# Patient Record
Sex: Male | Born: 1985 | Race: White | Hispanic: No | Marital: Single | Smoking: Never smoker
Health system: Southern US, Community
[De-identification: ages and names within clinical notes are randomized; demographics above are authoritative.]

## PROBLEM LIST (undated history)

## (undated) DIAGNOSIS — G8929 Other chronic pain: Principal | ICD-10-CM

## (undated) DIAGNOSIS — Z8739 Personal history of other diseases of the musculoskeletal system and connective tissue: Secondary | ICD-10-CM

## (undated) DIAGNOSIS — Z9889 Other specified postprocedural states: Secondary | ICD-10-CM

## (undated) DIAGNOSIS — M545 Low back pain: Principal | ICD-10-CM

## (undated) DIAGNOSIS — Z8619 Personal history of other infectious and parasitic diseases: Secondary | ICD-10-CM

## (undated) HISTORY — DX: Other chronic pain: G89.29

## (undated) HISTORY — DX: Low back pain: M54.5

## (undated) HISTORY — PX: INGUINAL HERNIA REPAIR: SUR1180

## (undated) HISTORY — DX: Personal history of other infectious and parasitic diseases: Z86.19

## (undated) HISTORY — DX: Other specified postprocedural states: Z98.890

## (undated) HISTORY — DX: Personal history of other diseases of the musculoskeletal system and connective tissue: Z87.39

---

## 2010-06-27 ENCOUNTER — Emergency Department: Payer: Self-pay | Admitting: Emergency Medicine

## 2012-01-05 ENCOUNTER — Ambulatory Visit (INDEPENDENT_AMBULATORY_CARE_PROVIDER_SITE_OTHER): Admitting: Family Medicine

## 2012-01-05 ENCOUNTER — Encounter: Payer: Self-pay | Admitting: Family Medicine

## 2012-01-05 VITALS — BP 114/78 | HR 64 | Temp 98.0°F | Ht 70.0 in | Wt 171.5 lb

## 2012-01-05 DIAGNOSIS — R002 Palpitations: Secondary | ICD-10-CM

## 2012-01-05 DIAGNOSIS — R42 Dizziness and giddiness: Secondary | ICD-10-CM

## 2012-01-05 LAB — HEMOGLOBIN AND HEMATOCRIT, BLOOD: Hemoglobin: 17 g/dL (ref 13.0–17.0)

## 2012-01-05 LAB — BASIC METABOLIC PANEL
Calcium: 10.2 mg/dL (ref 8.4–10.5)
Creatinine, Ser: 1.1 mg/dL (ref 0.4–1.5)

## 2012-01-05 LAB — TSH: TSH: 4.03 u[IU]/mL (ref 0.35–5.50)

## 2012-01-05 NOTE — Patient Instructions (Addendum)
EKG today - slow but reassuring.  You could have some benign extra heart beats - if more symptomatic or more frequent, let us know. Blood work today. Good to see you today, call us with questions. Return as needed or in 1-2 years for physical.

## 2012-01-05 NOTE — Assessment & Plan Note (Addendum)
Anticipate benign occasional PAC/PVC. EKG today - sinus bradycardia at rate 41, normal axis, intervals, no ST/T changes. Discussed this. Overall very healthy. Check blood work today as well. He states that his heart rate dose increase to 160-170 range with high intensity exercise.

## 2012-01-05 NOTE — Assessment & Plan Note (Signed)
Sounds resolved, possibly related to low sugar.  Will monitor for now.  Check sugar today.

## 2012-01-05 NOTE — Progress Notes (Signed)
Subjective:    Patient ID: Lance Carter, male    DOB: [birth year], 26 y.o.   MRN: [id number]  HPI CC: new pt  Having some issues with irregular heart beats as well as occasional lightheadedness when gets home from work.  Gets yearly physicals with [employer].  Irregular heartbeat for the last year.  Described as skipped beats and heart flutter.  Noticed by GF.  Uncle with similar issue, told to decrease caffeine.  Has decreased amt of caffeine.  Rare soda.  Sporadic.  Denies chest pain/tightness, SOB, HA, dizziness.  Has never had EKG.  Has had more stressed.  Lightheadedness for 1 wk then stopped, no issues since then.  Described as "low blood sugar feeling", "fuzzy headed" and weak.  No presyncope, vertigo, imbalanced.  Stays hydrated during day.  No significant increase in exercise/exertion.  Eating improved symptoms.  At that time was working 10 hour shifts.  Works as Consulting civil engineer for [employer] Currently seeking counseling for relationship. Currently sexually active, uses protection.  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 Tetanus 2007  Medications and allergies reviewed and updated in chart.  Past histories reviewed and updated if relevant as below. There is no problem list on file for this patient.  Past Medical History  Diagnosis Date  . History of chicken pox    Past Surgical History  Procedure Date  . Inguinal hernia repair     as child   History  Substance Use Topics  . Smoking status: Never Smoker   . Smokeless tobacco: Never Used  . Alcohol Use: No   Family History  Problem Relation Age of Onset  . Alcohol abuse Maternal Grandmother     cirrhosis, rest of maternal side  . Diabetes Paternal Grandfather    Allergies  Allergen Reactions  . Sulfa Antibiotics Hives    No current outpatient prescriptions on file prior to visit.     Review of Systems  Constitutional: Negative for fever, chills, activity change, appetite change, fatigue and unexpected weight change.  HENT: Negative for hearing loss and neck pain.   Eyes: Negative for visual disturbance.  Respiratory: Negative for cough, chest tightness, shortness of breath and wheezing.   Cardiovascular: Positive for palpitations (see HPI). Negative for chest pain and leg swelling.  Gastrointestinal: Negative for nausea, vomiting, abdominal pain, diarrhea, constipation, blood in stool and abdominal distention.  Genitourinary: Negative for hematuria and difficulty urinating.  Musculoskeletal: Negative for myalgias and arthralgias.  Skin: Negative for rash.  Neurological: Positive for dizziness (see HPI) and headaches (mild). Negative for seizures and syncope.  Hematological: Does not bruise/bleed easily.  Psychiatric/Behavioral: Negative for dysphoric mood. The patient is not nervous/anxious.        Objective:   Physical Exam  Nursing note and vitals reviewed. Constitutional: He is oriented to person, place, and time. He appears well-developed and well-nourished. No distress.  HENT:  Head: Normocephalic and atraumatic.  Right Ear: External ear normal.  Left Ear: External ear normal.  Nose: Nose normal.  Mouth/Throat: Oropharynx is clear and moist. No oropharyngeal exudate.  Eyes: Conjunctivae and EOM are normal. Pupils are equal, round, and reactive to light.  Neck: Normal range of motion. Neck supple. No thyromegaly present.  Cardiovascular: Normal rate, regular rhythm, normal heart sounds and intact distal pulses.   No murmur heard. Pulses:  Radial pulses are 2+ on the right side, and 2+ on the left side.  Pulmonary/Chest: Effort normal and breath sounds normal. No respiratory distress. He has no wheezes. He has no rales.  Abdominal: Soft. Bowel sounds are normal. He exhibits no  distension and no mass. There is no tenderness. There is no rebound and no guarding.  Musculoskeletal: Normal range of motion. He exhibits no edema.  Lymphadenopathy:    He has no cervical adenopathy.  Neurological: He is alert and oriented to person, place, and time.       CN grossly intact, station and gait intact  Skin: Skin is warm and dry. No rash noted.  Psychiatric: He has a normal mood and affect. His behavior is normal. Judgment and thought content normal.       Assessment & Plan:

## 2013-06-26 DIAGNOSIS — M545 Low back pain, unspecified: Secondary | ICD-10-CM

## 2013-06-26 DIAGNOSIS — G8929 Other chronic pain: Secondary | ICD-10-CM

## 2013-06-26 HISTORY — DX: Other chronic pain: G89.29

## 2013-06-26 HISTORY — PX: LUMBAR DISC SURGERY: SHX700

## 2013-06-26 HISTORY — DX: Low back pain, unspecified: M54.50

## 2014-04-26 DIAGNOSIS — Z8739 Personal history of other diseases of the musculoskeletal system and connective tissue: Secondary | ICD-10-CM

## 2014-04-26 DIAGNOSIS — Z9889 Other specified postprocedural states: Secondary | ICD-10-CM

## 2014-04-26 HISTORY — DX: Personal history of other diseases of the musculoskeletal system and connective tissue: Z98.890

## 2014-04-26 HISTORY — DX: Personal history of other diseases of the musculoskeletal system and connective tissue: Z87.39

## 2015-03-17 ENCOUNTER — Emergency Department (HOSPITAL_COMMUNITY)
Admission: EM | Admit: 2015-03-17 | Discharge: 2015-03-17 | Disposition: A | Discharge status: Home / Self Care | Attending: Emergency Medicine | Admitting: Emergency Medicine

## 2015-03-17 ENCOUNTER — Emergency Department (HOSPITAL_COMMUNITY)

## 2015-03-17 ENCOUNTER — Encounter (HOSPITAL_COMMUNITY): Payer: Self-pay | Admitting: Emergency Medicine

## 2015-03-17 DIAGNOSIS — R52 Pain, unspecified: Secondary | ICD-10-CM

## 2015-03-17 DIAGNOSIS — M545 Low back pain: Secondary | ICD-10-CM

## 2015-03-17 DIAGNOSIS — R200 Anesthesia of skin: Secondary | ICD-10-CM | POA: Insufficient documentation

## 2015-03-17 DIAGNOSIS — Z8619 Personal history of other infectious and parasitic diseases: Secondary | ICD-10-CM | POA: Diagnosis not present

## 2015-03-17 DIAGNOSIS — Z79899 Other long term (current) drug therapy: Secondary | ICD-10-CM | POA: Diagnosis not present

## 2015-03-17 DIAGNOSIS — M5440 Lumbago with sciatica, unspecified side: Secondary | ICD-10-CM | POA: Insufficient documentation

## 2015-03-17 MED ORDER — METHOCARBAMOL 500 MG PO TABS: 500.0000 mg | ORAL_TABLET | Freq: Two times a day (BID) | ORAL | Status: DC | PRN

## 2015-03-17 NOTE — Discharge Instructions (Signed)
Back Pain, Adult °Low back pain is very common. About 1 in 5 people have back pain. The cause of low back pain is rarely dangerous. The pain often gets better over time. About half of people with a sudden onset of back pain feel better in just 2 weeks. About 8 in 10 people feel better by 6 weeks.  °CAUSES °Some common causes of back pain include: °· Strain of the muscles or ligaments supporting the spine. °· Wear and tear (degeneration) of the spinal discs. °· Arthritis. °· Direct injury to the back. °DIAGNOSIS °Most of the time, the direct cause of low back pain is not known. However, back pain can be treated effectively even when the exact cause of the pain is unknown. Answering your caregiver's questions about your overall health and symptoms is one of the most accurate ways to make sure the cause of your pain is not dangerous. If your caregiver needs more information, he or she may order lab work or imaging tests (X-rays or MRIs). However, even if imaging tests show changes in your back, this usually does not require surgery. °HOME CARE INSTRUCTIONS °For many people, back pain returns. Since low back pain is rarely dangerous, it is often a condition that people can learn to manage on their own.  °· Remain active. It is stressful on the back to sit or stand in one place. Do not sit, drive, or stand in one place for more than 30 minutes at a time. Take short walks on level surfaces as soon as pain allows. Try to increase the length of time you walk each day. °· Do not stay in bed. Resting more than 1 or 2 days can delay your recovery. °· Do not avoid exercise or work. Your body is made to move. It is not dangerous to be active, even though your back may hurt. Your back will likely heal faster if you return to being active before your pain is gone. °· Pay attention to your body when you  bend and lift. Many people have less discomfort when lifting if they bend their knees, keep the load close to their bodies, and  avoid twisting. Often, the most comfortable positions are those that put less stress on your recovering back. °· Find a comfortable position to sleep. Use a firm mattress and lie on your side with your knees slightly bent. If you lie on your back, put a pillow under your knees. °· Only take over-the-counter or prescription medicines as directed by your caregiver. Over-the-counter medicines to reduce pain and inflammation are often the most helpful. Your caregiver may prescribe muscle relaxant drugs. These medicines help dull your pain so you can more quickly return to your normal activities and healthy exercise. °· Put ice on the injured area. °¨ Put ice in a plastic bag. °¨ Place a towel between your skin and the bag. °¨ Leave the ice on for 15-20 minutes, 03-04 times a day for the first 2 to 3 days. After that, ice and heat may be alternated to reduce pain and spasms. °· Ask your caregiver about trying back exercises and gentle massage. This may be of some benefit. °· Avoid feeling anxious or stressed. Stress increases muscle tension and can worsen back pain. It is important to recognize when you are anxious or stressed and learn ways to manage it. Exercise is a great option. °SEEK MEDICAL CARE IF: °· You have pain that is not relieved with rest or medicine. °· You have pain that does not improve in 1 week. °· You have new symptoms. °· You are generally not feeling well. °SEEK   IMMEDIATE MEDICAL CARE IF:  °· You have pain that radiates from your back into your legs. °· You develop new bowel or bladder control problems. °· You have unusual weakness or numbness in your arms or legs. °· You develop nausea or vomiting. °· You develop abdominal pain. °· You feel faint. °Document Released: 06/12/2005 Document Revised: 12/12/2011 Document Reviewed: 10/14/2013 °ExitCare® Patient Information ©2015 [employer]. This information is not intended to replace advice given to you by your health care provider. Make sure you  discuss any questions you have with your health care provider. ° °Back Exercises °Back exercises help treat and prevent back injuries. The goal of back exercises is to increase the strength of your abdominal and back muscles and the flexibility of your back. These exercises should be started when you no longer have back pain. Back exercises include: °· Pelvic Tilt. Lie on your back with your knees bent. Tilt your pelvis until the lower part of your back is against the floor. Hold this position 5 to 10 sec and repeat 5 to 10 times. °· Knee to Chest. Pull first 1 knee up against your chest and hold for 20 to 30 seconds, repeat this with the other knee, and then both knees. This may be done with the other leg straight or bent, whichever feels better. °· Sit-Ups or Curl-Ups. Bend your knees 90 degrees. Start with tilting your pelvis, and do a partial, slow sit-up, lifting your trunk only 30 to 45 degrees off the floor. Take at least 2 to 3 seconds for each sit-up. Do not do sit-ups with your knees out straight. If partial sit-ups are difficult, simply do the above but with only tightening your abdominal muscles and holding it as directed. °· Hip-Lift. Lie on your back with your knees flexed 90 degrees. Push down with your feet and shoulders as you raise your hips a couple inches off the floor; hold for 10 seconds, repeat 5 to 10 times. °· Back arches. Lie on your stomach, propping yourself up on bent elbows. Slowly press on your hands, causing an arch in your low back. Repeat 3 to 5 times. Any initial stiffness and discomfort should lessen with repetition over time. °· Shoulder-Lifts. Lie face down with arms beside your body. Keep hips and torso pressed to floor as you slowly lift your head and shoulders off the floor. °Do not overdo your exercises, especially in the beginning. Exercises may cause you some mild back discomfort which lasts for a few minutes; however, if the pain is more severe, or lasts for more than 15  minutes, do not continue exercises until you see your caregiver. Improvement with exercise therapy for back problems is slow.  °See your caregivers for assistance with developing a proper back exercise program. °Document Released: 07/20/2004 Document Revised: 09/04/2011 Document Reviewed: 04/13/2011 °ExitCare® Patient Information ©2015 [employer]. This information is not intended to replace advice given to you by your health care provider. Make sure you discuss any questions you have with your health care provider. ° °

## 2015-03-17 NOTE — ED Notes (Signed)
Patient here with lower back pain for the last week.  Patient states that he has had previous back surgery in 2015 on Lumbar/sacral area.  Patient states that he rode his motorcycle to work and noticed that he started having lower back pain with some groin numbness.  This was two weeks ago.  Patient denies any injury to the area.  Patient states that he feels like he may have ruptured some discs as with previous back pain.

## 2015-03-17 NOTE — ED Provider Notes (Signed)
CSN: [id number]     Arrival date & time 03/17/15  1848 History   First MD Initiated Contact with Patient 03/17/15 2015     Chief Complaint  Patient presents with  . Back Pain    Lance Carter is a 29 y.o. male with a history of lumbar spine surgery for ruptured disk in 2015 who presents to the ED complaining of gradual worsening onset of bilateral low back pain worse on his left side. He reports gradual onset of pain 1.5 weeks ago after coming home from the gym. He also reports intermittent pain radiating to his left leg, but none currently. He also reports having three episodes of feeling like his groin was numb that resolved spontaneously. Patient reports his back pain is worse with movement. He currently rates his pain at 8 out of 10. He reports taking Percocet with some relief of his pain. The patient denies fevers, history of IV drug use, history cancer, loss of bladder control, loss of bowel control, difficulty urinating, urinary symptoms, abdominal pain, nausea, vomiting, rashes, penile discharge, penile pain, scrotal swelling or scrotal pain.   (Consider location/radiation/quality/duration/timing/severity/associated sxs/prior Treatment) HPI  Past Medical History  Diagnosis Date  . History of chicken pox    Past Surgical History  Procedure Laterality Date  . Inguinal hernia repair      as child  . Back surgery     Family History  Problem Relation Age of Onset  . Alcohol abuse Maternal Grandmother     cirrhosis, rest of maternal side  . Diabetes Paternal Grandfather    Social History  Substance Use Topics  . Smoking status: Never Smoker   . Smokeless tobacco: Never Used  . Alcohol Use: No    Review of Systems  Constitutional: Negative for fever and chills.  HENT: Negative for congestion and sore throat.   Eyes: Negative for visual disturbance.  Respiratory: Negative for cough and shortness of breath.   Cardiovascular: Negative for chest pain.  Gastrointestinal:  Negative for nausea, vomiting, abdominal pain and diarrhea.  Genitourinary: Negative for dysuria, urgency, frequency, hematuria, decreased urine volume, discharge, penile swelling, scrotal swelling, difficulty urinating and testicular pain.  Musculoskeletal: Positive for back pain. Negative for gait problem, neck pain and neck stiffness.  Skin: Negative for rash.  Neurological: Positive for numbness. Negative for dizziness, syncope, light-headedness and headaches.      Allergies  Sulfa antibiotics  Home Medications   Prior to Admission medications   Medication Sig Start Date End Date Taking? Authorizing Provider  gabapentin (NEURONTIN) 300 MG capsule Take 600 mg by mouth at bedtime. 12/25/14  Yes Historical Provider, MD  oxyCODONE-acetaminophen (PERCOCET/ROXICET) 5-325 MG per tablet Take 1 tablet by mouth every 4 (four) hours as needed for severe pain.   Yes Historical Provider, MD  methocarbamol (ROBAXIN) 500 MG tablet Take 1 tablet (500 mg total) by mouth 2 (two) times daily as needed for muscle spasms. 03/17/15   Everlene Farrier, PA-C   BP 133/98 mmHg  Pulse 82  Temp(Src) 97.9 F (36.6 C) (Oral)  Resp 16  Ht 6' (1.829 m)  Wt 200 lb (90.719 kg)  BMI 27.12 kg/m2  SpO2 98% Physical Exam  Constitutional: He is oriented to person, place, and time. He appears well-developed and well-nourished. No distress.  Nontoxic appearing.  HENT:  Head: Normocephalic and atraumatic.  Mouth/Throat: Oropharynx is clear and moist.  Eyes: Conjunctivae are normal. Pupils are equal, round, and reactive to light. Right eye exhibits no discharge. Left eye exhibits  no discharge.  Neck: Normal range of motion. Neck supple. No JVD present. No tracheal deviation present.  No midline neck tenderness.  Cardiovascular: Normal rate, regular rhythm, normal heart sounds and intact distal pulses.  Exam reveals no gallop and no friction rub.   No murmur heard. Bilateral radial, posterior tibialis and dorsalis pedis  pulses are intact.    Pulmonary/Chest: Effort normal and breath sounds normal. No respiratory distress. He has no wheezes. He has no rales.  Abdominal: Soft. Bowel sounds are normal. There is no tenderness. There is no guarding.  Genitourinary: Rectal exam shows anal tone normal.  Rectal exam preformed by me and chaperoned by RN. Anal sphincter tone is normal and sensation is intact.   Musculoskeletal: Normal range of motion. He exhibits no edema or tenderness.  Patient able to ambulate with normal gait. No midline neck or back tenderness. No back edema, deformity, ecchymosis or erythema noted. Patient has 5 out of 5 strength in his bilateral upper and lower extremities. No lower extremity edema or tenderness.   Lymphadenopathy:    He has no cervical adenopathy.  Neurological: He is alert and oriented to person, place, and time. He has normal reflexes. He displays normal reflexes. Coordination normal.  Sensation is intact in his bilateral upper and lower extremities. Patient able to ambulate with normal gait and without difficulty or assistance. Bilateral patellar DTRs are intact.  Skin: Skin is warm and dry. No rash noted. He is not diaphoretic. No erythema. No pallor.  Psychiatric: He has a normal mood and affect. His behavior is normal.  Nursing note and vitals reviewed.   ED Course  Procedures (including critical care time) Labs Review Labs Reviewed - No data to display  Imaging Review Dg Lumbar Spine Complete  03/17/2015   CLINICAL DATA:  Pain lower lumbar region and a little to the left x 1 1/2 weeks. Patient feels like he re-ruptured L5/S1. Hx of having surgery 1 yr. Ago with ruptured disc. Unknown how he injured himself  EXAM: LUMBAR SPINE - COMPLETE 4+ VIEW  COMPARISON:  None.  FINDINGS: There is no evidence of lumbar spine fracture. Alignment is normal. Disc height loss noted at L5-S1.  IMPRESSION: No evidence for acute  abnormality.   Electronically Signed   By: Norva Pavlov  M.D.   On: 03/17/2015 21:10   Dg Sacrum/coccyx  03/17/2015   CLINICAL DATA:  Sacrococcygeal pain.  EXAM: SACRUM AND COCCYX - 2+ VIEW  COMPARISON:  None.  FINDINGS: There is no evidence of fracture or other focal bone lesions. Sacroiliac joints appear normal.  IMPRESSION: Normal sacrum and coccyx.   Electronically Signed   By: Lupita Raider, M.D.   On: 03/17/2015 21:12   I have personally reviewed and evaluated these images as part of my medical decision-making.   EKG Interpretation None      Filed Vitals:   03/17/15 1858 03/17/15 2257  BP: 151/99 133/98  Pulse: 76 82  Temp: 97.9 F (36.6 C)   TempSrc: Oral   Resp: 18 16  Height: 6' (1.829 m)   Weight: 200 lb (90.719 kg)   SpO2: 96% 98%     MDM   Meds given in ED:  Medications - No data to display  Discharge Medication List as of 03/17/2015 10:30 PM    START taking these medications   Details  methocarbamol (ROBAXIN) 500 MG tablet Take 1 tablet (500 mg total) by mouth 2 (two) times daily as needed for muscle spasms., Starting  03/17/2015, Until Discontinued, Print        Final diagnoses:  Bilateral low back pain, with sciatica presence unspecified    This  is a 29 y.o. male with a history of lumbar spine surgery for ruptured disk in 2015 who presents to the ED complaining of gradual worsening onset of bilateral low back pain worse on his left side. He reports gradual onset of pain 1.5 weeks ago after coming home from the gym. He also reports intermittent pain radiating to his left leg, but none currently. He also reports having three episodes of feeling like his groin was numb that resolved spontaneously. Patient reports his back pain is worse with movement.  No neurological deficits and normal neuro exam. Anal sphincter tone is normal. Good strength in his bilateral upper and lower extremities.  Patient can walk but states is painful.  No loss of bowel or bladder control.  No concern for cauda equina.  No fever, night  sweats, weight loss, h/o cancer, IVDU. X-rays were obtained of his lumbar and sacrum and coccyx which were unremarkable. I suggested use of NSAIDs for treatment rather than Percocet for the patient reports he gets acid reflux with use of NSAIDs. A prior the patient pressures and for a box and encouraged him to follow-up with primary care and spinal surgery for possible further management of his back pain. I provided the patient with follow-up information for Dr. Lovell Sheehan from neurosurgery. I discussed strict return precautions. I advised the patient to follow-up with their primary care provider this week. I advised the patient to return to the emergency department with new or worsening symptoms or new concerns. The patient verbalized understanding and agreement with plan.    This patient was discussed with Dr. Littie Deeds who agrees with assessment and plan.     Everlene Farrier, PA-C 03/18/15 0155  Mirian Mo, MD 03/19/15 [phone]

## 2015-05-12 ENCOUNTER — Encounter: Payer: Self-pay | Admitting: Family Medicine

## 2015-05-12 ENCOUNTER — Ambulatory Visit (INDEPENDENT_AMBULATORY_CARE_PROVIDER_SITE_OTHER): Admitting: Family Medicine

## 2015-05-12 ENCOUNTER — Encounter (INDEPENDENT_AMBULATORY_CARE_PROVIDER_SITE_OTHER): Payer: Self-pay

## 2015-05-12 VITALS — BP 116/86 | HR 73 | Temp 98.1°F | Ht 70.0 in | Wt 205.4 lb

## 2015-05-12 DIAGNOSIS — G8929 Other chronic pain: Secondary | ICD-10-CM | POA: Diagnosis not present

## 2015-05-12 DIAGNOSIS — Z8739 Personal history of other diseases of the musculoskeletal system and connective tissue: Secondary | ICD-10-CM | POA: Diagnosis not present

## 2015-05-12 DIAGNOSIS — M545 Low back pain, unspecified: Secondary | ICD-10-CM

## 2015-05-12 DIAGNOSIS — Z9889 Other specified postprocedural states: Secondary | ICD-10-CM | POA: Diagnosis not present

## 2015-05-12 MED ORDER — OXYCODONE-ACETAMINOPHEN 5-325 MG PO TABS: 1.0000 | ORAL_TABLET | Freq: Three times a day (TID) | ORAL | Status: DC | PRN

## 2015-05-12 MED ORDER — GABAPENTIN 300 MG PO CAPS: 300.0000 mg | ORAL_CAPSULE | Freq: Two times a day (BID) | ORAL | Status: DC

## 2015-05-12 NOTE — Patient Instructions (Addendum)
Restart neurontin. I've printed out prescription for percocets for you. Use sparingly for breakthrough pain. Start with ibuprofen 400-600mg  with food up to three times a day when in a flare. Return as needed  Good to see you today.

## 2015-05-12 NOTE — Progress Notes (Signed)
Pre visit review using our clinic review tool, if applicable. No additional management support is needed unless otherwise documented below in the visit note. 

## 2015-05-12 NOTE — Progress Notes (Signed)
BP 116/86 mmHg  Pulse 73  Temp(Src) 98.1 F (36.7 C) (Oral)  Ht 5\' 10"  (1.778 m)  Wt 205 lb 6.4 oz (93.169 kg)  BMI 29.47 kg/m2  SpO2 98%   CC: re-establish care Subjective:    Patient ID: Lance AllanWilliam Delavega, male    DOB: [birth year], 29 y.o.   MRN: [id number]  HPI: Lance Carter is a 29 y.o. male presenting on 05/12/2015 for Establish Care and Pain Management   Last seen 12/2011. H/o lumbar surgery for ruptured disc early 2015 - while deployed in [place]. 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 [facility]. Has developed 3/10 chronic back pain with radiation down left leg. When having flare 10/10 pain. Describes lower back pain with radiation down left leg. No numbness or weakness of legs. No bowel/bladder incontinence.   Seen at ER 02/2015 with 1.5wk worsening lower back pain with radiation to left as well as groin numbness. Records reviewed. Xray showed disc height loss L5/S1 without acute fracture. Treated with robaxin muscle relaxant which hasn't helped. Flare happened after riding motorcycle to work, working out at gym. Off bike, out of gym since.   Finds neurontin is helpful - takes regularly. Takes percocet prn (a few times a week).   NSAID intolerance - worsening GERD.  Tramadol causes nausea, insects crawling throughout body. Vicodin not as effective. [facility]. Received #60 in September to last 6 months but he had above flare and needed higher dose.   Denies smoking, alcohol use, recreational drug use.  Relevant past medical, surgical, family and social history reviewed and updated as indicated. Interim medical history since our last visit reviewed. Allergies and medications reviewed and updated. No current outpatient prescriptions on file prior to visit.   No current facility-administered medications on file prior to visit.    Review of Systems Per HPI unless specifically indicated in ROS section     Objective:    BP 116/86 mmHg  Pulse  73  Temp(Src) 98.1 F (36.7 C) (Oral)  Ht 5\' 10"  (1.778 m)  Wt 205 lb 6.4 oz (93.169 kg)  BMI 29.47 kg/m2  SpO2 98%  Wt Readings from Last 3 Encounters:  05/12/15 205 lb 6.4 oz (93.169 kg)  03/17/15 200 lb (90.719 kg)  01/05/12 171 lb 8 oz (77.792 kg)   Physical Exam  Constitutional: He is oriented to person, place, and time. He appears well-developed and well-nourished. No distress.  HENT:  Head: Normocephalic and atraumatic.  Right Ear: Hearing, tympanic membrane and ear canal normal.  Left Ear: Hearing, tympanic membrane and ear canal normal.  Nose: Nose normal.  Mouth/Throat: Uvula is midline, oropharynx is clear and moist and mucous membranes are normal. No oropharyngeal exudate, posterior oropharyngeal edema or posterior oropharyngeal erythema.  Cerumen filled canals  Eyes: Conjunctivae and EOM are normal. Pupils are equal, round, and reactive to light. No scleral icterus.  Neck: Normal range of motion. Neck supple. No thyromegaly present.  Cardiovascular: Normal rate, regular rhythm, normal heart sounds and intact distal pulses.   No murmur heard. Pulses:      Radial pulses are 2+ on the right side, and 2+ on the left side.  Pulmonary/Chest: Effort normal and breath sounds normal. No respiratory distress. He has no wheezes. He has no rales.  Abdominal: Soft. Bowel sounds are normal. He exhibits no distension and no mass. There is no tenderness. There is no rebound and no guarding.  Musculoskeletal: Normal range of motion. He exhibits no edema.  Small incision midline lower lumbar region  No pain midline spine No paraspinous mm tenderness + SLR on left No pain with int/ext rotation at hip. Neg FABER.  Lymphadenopathy:    He has no cervical adenopathy.  Neurological: He is alert and oriented to person, place, and time.  Reflex Scores:      Patellar reflexes are 2+ on the right side and 2+ on the left side.      Achilles reflexes are 2+ on the right side and 0 on the left  side. CN grossly intact, station and gait intact Sensation intact Strength 5/5 BLE  Skin: Skin is warm and dry. No rash noted.  Psychiatric: He has a normal mood and affect. His behavior is normal. Judgment and thought content normal.  Nursing note and vitals reviewed.  Dg Lumbar Spine Complete  03/17/2015 CLINICAL DATA: Pain lower lumbar region and a little to the left x 1 1/2 weeks. Patient feels like he re-ruptured L5/S1. Hx of having surgery 1 yr. Ago with ruptured disc. Unknown how he injured himself EXAM: LUMBAR SPINE - COMPLETE 4+ VIEW COMPARISON: None. FINDINGS: There is no evidence of lumbar spine fracture. Alignment is normal. Disc height loss noted at L5-S1. IMPRESSION: No evidence for acute abnormality. Electronically Signed By: Norva Pavlov M.D. On: 03/17/2015 21:10   Dg Sacrum/coccyx  03/17/2015 CLINICAL DATA: Sacrococcygeal pain. EXAM: SACRUM AND COCCYX - 2+ VIEW COMPARISON: None. FINDINGS: There is no evidence of fracture or other focal bone lesions. Sacroiliac joints appear normal. IMPRESSION: Normal sacrum and coccyx. Electronically Signed By: Lupita Raider, M.D. On: 03/17/2015 21:12     Assessment & Plan:   Problem List Items Addressed This Visit    S/P discectomy for herniated nucleus pulposus   Chronic lower back pain - Primary    Reviewed history to date - HNP with L lumbar radiculopathy while deployed in [place] s/p discectomy 04/2014 with residual pain. Having trouble finding neurosurgeon that will take his [employer]. Reviewed treatment regimens to date as well as intolerances and failures (most Rx NSAIDs, tramadol, hydrocodone). Refilled gabapentin  BID for nerve pain. Intolerant to several other regimens. Provided with #30 percocet 5/325 for breakthrough pain. Discussed starting with tylenol or ibuprofen for pain.  Discussed concerns with chronic narcotic including addiction/abuse potential, habit forming nature, and  dependence/tolerance.  Pt declines establishing pain contract at our clinic today, thinks he will want to continue seeking care at [facility] - to schedule f/u early 2017.  Records from prior PCP at [facility] requested today.      Relevant Medications   oxyCODONE-acetaminophen (PERCOCET/ROXICET) 5-325 MG tablet       Follow up plan: Return if symptoms worsen or fail to improve.

## 2015-05-12 NOTE — Assessment & Plan Note (Signed)
Reviewed history to date - HNP with L lumbar radiculopathy while deployed in [place] s/p discectomy 04/2014 with residual pain. Having trouble finding neurosurgeon that will take his [employer] insurance. Reviewed treatment regimens to date as well as intolerances and failures (most Rx NSAIDs, tramadol, hydrocodone). Refilled gabapentin 300mg  BID for nerve pain. Intolerant to several other regimens. Provided with #30 percocet 5/325 for breakthrough pain. Discussed starting with tylenol or ibuprofen for pain.  Discussed concerns with chronic narcotic including addiction/abuse potential, habit forming nature, and dependence/tolerance.  Pt declines establishing pain contract at our clinic today, thinks he will want to continue seeking [facility] - to schedule f/u early 2017.  [facility] requested today.

## 2016-11-27 ENCOUNTER — Emergency Department (HOSPITAL_COMMUNITY)
Admission: EM | Admit: 2016-11-27 | Discharge: 2016-11-27 | Disposition: A | Discharge status: Home / Self Care | Attending: Emergency Medicine | Admitting: Emergency Medicine

## 2016-11-27 ENCOUNTER — Encounter (HOSPITAL_COMMUNITY): Payer: Self-pay | Admitting: Emergency Medicine

## 2016-11-27 ENCOUNTER — Emergency Department (HOSPITAL_COMMUNITY)

## 2016-11-27 DIAGNOSIS — Z79899 Other long term (current) drug therapy: Secondary | ICD-10-CM | POA: Diagnosis not present

## 2016-11-27 DIAGNOSIS — E860 Dehydration: Secondary | ICD-10-CM | POA: Diagnosis not present

## 2016-11-27 DIAGNOSIS — R55 Syncope and collapse: Secondary | ICD-10-CM | POA: Insufficient documentation

## 2016-11-27 LAB — CBC WITH DIFFERENTIAL/PLATELET
BASOS PCT: 0 %
Basophils Absolute: 0 10*3/uL (ref 0.0–0.1)
Eosinophils Absolute: 0.1 10*3/uL (ref 0.0–0.7)
Eosinophils Relative: 1 %
HCT: 41.7 % (ref 39.0–52.0)
HEMOGLOBIN: 14.9 g/dL (ref 13.0–17.0)
LYMPHS ABS: 1.6 10*3/uL (ref 0.7–4.0)
Lymphocytes Relative: 17 %
MCH: 30.1 pg (ref 26.0–34.0)
MCHC: 35.7 g/dL (ref 30.0–36.0)
MCV: 84.2 fL (ref 78.0–100.0)
MONO ABS: 0.6 10*3/uL (ref 0.1–1.0)
MONOS PCT: 7 %
NEUTROS ABS: 7.1 10*3/uL (ref 1.7–7.7)
NEUTROS PCT: 75 %
Platelets: 239 10*3/uL (ref 150–400)
RBC: 4.95 MIL/uL (ref 4.22–5.81)
RDW: 11.7 % (ref 11.5–15.5)
WBC: 9.4 10*3/uL (ref 4.0–10.5)

## 2016-11-27 LAB — BASIC METABOLIC PANEL
Anion gap: 8 (ref 5–15)
BUN: 14 mg/dL (ref 6–20)
CALCIUM: 9.2 mg/dL (ref 8.9–10.3)
CHLORIDE: 106 mmol/L (ref 101–111)
CO2: 24 mmol/L (ref 22–32)
CREATININE: 1 mg/dL (ref 0.61–1.24)
GFR calc non Af Amer: >60 mL/min (ref >60)
GLUCOSE: 113 mg/dL — AB (ref 65–99)
Potassium: 3.6 mmol/L (ref 3.5–5.1)
Sodium: 138 mmol/L (ref 135–145)

## 2016-11-27 MED ORDER — SODIUM CHLORIDE 0.9 % IV BOLUS (SEPSIS)
1000.0000 mL | Freq: Once | INTRAVENOUS | Status: AC
  Administered 2016-11-27: 1000 mL via INTRAVENOUS

## 2016-11-27 NOTE — ED Notes (Signed)
Ambulated in hall with no assistance needed.  Tolerated well.  No distress noted at this time.

## 2016-11-27 NOTE — ED Notes (Signed)
Nurse drawing labs. 

## 2016-11-27 NOTE — ED Triage Notes (Addendum)
Reports standing in bathroom urinating and felt like he was going to pass out.  Wife found on the floor approximately 2 minutes after going to the bathroom.  C/o pain in right lower back from hitting bathtub during fall.   Does report having three beers earlier in the night.

## 2016-11-27 NOTE — Discharge Instructions (Signed)
You were seen today for passing out. This is likely multifactorial but likely related to dehydration. There is also well-known entity of post micturition syncope which means passing out with urination. Given your history of vasovagal syncope, this could also have come into play. Your workup is reassuring. You were hydrated. Make sure to stay hydrated at home and avoid prolonged sun exposure. Follow-up with your primary physician.

## 2016-11-27 NOTE — ED Notes (Signed)
Unable to get a signature due to failure of sig pad.  Verbalized understanding of instructions.

## 2016-11-27 NOTE — ED Provider Notes (Signed)
[facility]-EMERGENCY DEPT Provider Note   CSN: [id number] Arrival date & time: 11/27/16  0421     History   Chief Complaint Chief Complaint  Patient presents with  . Loss of Consciousness    HPI Lance Carter is a 31 y.o. male.  HPI  This is a 31 year old male who presents with syncope. Patient reports that he got up to urinate. He had a feeling that he was going pass out. He tried to sit down but the next thing he remembers is his wife waking him up on the floor. He thinks he hit his right posterior chest wall upon falling. Denies any chest pain or shortness of breath. Patient does report drinking 3 beers last night which is more than normal for him. He also reports recent significant sun exposure and possible dehydration. Patient states that he had to crawl to the bedroom because of continued lightheadedness in this and nausea. Wife took his blood pressure and noted it to be low. No history of early cardiac death in the family. Patient does report vasovagal syncope with blood draws in the past.  Past Medical History:  Diagnosis Date  . Chronic lower back pain 2015   with left lumbar radiculopathy  . History of chicken pox   . S/P discectomy for herniated nucleus pulposus 04/2014    Patient Active Problem List   Diagnosis Date Noted  . Chronic lower back pain   . S/P discectomy for herniated nucleus pulposus 04/26/2014    Past Surgical History:  Procedure Laterality Date  . INGUINAL HERNIA REPAIR     as child  . 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 Authorizing Provider  gabapentin (NEURONTIN) 300 MG capsule Take 300 mg by mouth 4 (four) times daily.   Yes [provider]  oxyCODONE-acetaminophen (PERCOCET/ROXICET) 5-325 MG tablet Take 1 tablet by mouth 3 (three) times daily. 05/12/15  Yes [provider]  pramipexole (MIRAPEX) 0.25  MG tablet Take 0.25 mg by mouth at bedtime.    Yes [provider]  gabapentin (NEURONTIN) 300 MG capsule Take 1 capsule (300 mg total) by mouth 2 (two) times daily. Patient not taking: Reported on 11/27/2016 05/12/15   Eustaquio Boyden, MD  oxyCODONE-acetaminophen (PERCOCET/ROXICET) 5-325 MG tablet Take 1 tablet by mouth 3 (three) times daily as needed for severe pain. Patient not taking: Reported on 11/27/2016 05/12/15   Eustaquio Boyden, MD    Family History Family History  Problem Relation Age of Onset  . Alcohol abuse Maternal Grandmother        cirrhosis, rest of maternal side  . Diabetes Paternal Grandfather     Social History Social History  Substance Use Topics  . Smoking status: Never Smoker  . Smokeless tobacco: Never Used  . Alcohol use 0.0 oz/week     Comment: rarely     Allergies   Sulfa antibiotics and Tramadol   Review of Systems Review of Systems  Constitutional: Negative for fever.  Respiratory: Negative for shortness of breath.   Cardiovascular: Positive for chest pain.  Gastrointestinal: Positive for nausea. Negative for abdominal pain and vomiting.  Neurological: Positive for syncope and light-headedness. Negative for weakness and headaches.  All other systems reviewed and are negative.    Physical Exam Updated Vital Signs BP 110/75   Pulse 97   Temp 98.2 F (36.8 C) (  Oral)   Resp 11   Ht 5\' 10"  (1.778 m)   Wt 99.8 kg (220 lb)   SpO2 94%   BMI 31.57 kg/m   Physical Exam  Constitutional: He is oriented to person, place, and time. He appears well-developed and well-nourished. No distress.  HENT:  Head: Normocephalic and atraumatic.  Eyes:  Pupils 4 mm reactive bilaterally  Cardiovascular: Normal rate, regular rhythm and normal heart sounds.   No murmur heard. Pulmonary/Chest: Effort normal and breath sounds normal. No respiratory distress. He has no wheezes.  Abdominal: Soft. Bowel sounds are normal. There is no tenderness. There  is no rebound.  Musculoskeletal: He exhibits no edema.  Neurological: He is alert and oriented to person, place, and time.  Cranial nerves II through XII intact, 5 out of 5 strength in all 4 extremities, no dysmetria to finger-nose-finger  Skin: Skin is warm and dry.  Psychiatric: He has a normal mood and affect.  Nursing note and vitals reviewed.    ED Treatments / Results  Labs (all labs ordered are listed, but only abnormal results are displayed) Labs Reviewed  BASIC METABOLIC PANEL - Abnormal; Notable for the following:       Result Value   Glucose, Bld 113 (*)    All other components within normal limits  CBC WITH DIFFERENTIAL/PLATELET    EKG  EKG Interpretation  Date/Time:  Monday November 27 2016 04:30:18 EDT Ventricular Rate:  90 PR Interval:    QRS Duration: 105 QT Interval:  347 QTC Calculation: 425 R Axis:   14 Text Interpretation:  Sinus rhythm Borderline prolonged PR interval RSR' in V1 or V2, probably normal variant Confirmed by Ross MarcusHorton, Raisa Ditto ([id number]) on 11/27/2016 5:39:11 AM       Radiology Dg Chest 2 View  Result Date: 11/27/2016 CLINICAL DATA:  Fall onto bathtub with right posterior rib pain. EXAM: CHEST  2 VIEW COMPARISON:  None. FINDINGS: The cardiomediastinal contours are normal. The lungs are clear. Pulmonary vasculature is normal. No consolidation, pleural effusion, or pneumothorax. No acute osseous abnormalities are grossly displaced rib fractures are seen. IMPRESSION: No evidence of acute abnormality.  No rib fractures are seen. Electronically Signed   By: Rubye OaksMelanie  Ehinger M.D.   On: 11/27/2016 06:18    Procedures Procedures (including critical care time)  Medications Ordered in ED Medications  sodium chloride 0.9 % bolus 1,000 mL (0 mLs Intravenous Stopped 11/27/16 0555)  sodium chloride 0.9 % bolus 1,000 mL (0 mLs Intravenous Stopped 11/27/16 0626)     Initial Impression / Assessment and Plan / ED Course  I have reviewed the triage vital signs and  the nursing notes.  Pertinent labs & imaging results that were available during my care of the patient were reviewed by me and considered in my medical decision making (see chart for details).     Patient presents with syncopal episode. He is nontoxic on exam. Vital signs reassuring. No evidence of arrhythmia on EKG. With orthostatic testing, patient became profoundly dizzy with a drop in his blood pressure upon standing. He was given 2 L of fluid. Basic labwork is reassuring. After 2 L of fluid, patient states he feels much better.  He is ambulatory without difficulty and no longer feels lightheaded. Suspect dehydration in addition to vasovagal syncope. Patient and wife reassured. Follow-up with primary physician. Precautions regarding dehydration provided.  After history, exam, and medical workup I feel the patient has been appropriately medically screened and is safe for discharge home. Pertinent diagnoses were  discussed with the patient. Patient was given return precautions.   Final Clinical Impressions(s) / ED Diagnoses   Final diagnoses:  Vasovagal syncope  Dehydration    New Prescriptions New Prescriptions   No medications on file     Shon Baton, MD 11/27/16 [phone]

## 2017-09-24 ENCOUNTER — Emergency Department (HOSPITAL_COMMUNITY)
Admission: EM | Admit: 2017-09-24 | Discharge: 2017-09-25 | Disposition: A | Discharge status: Home / Self Care | Attending: Emergency Medicine | Admitting: Emergency Medicine

## 2017-09-24 ENCOUNTER — Other Ambulatory Visit: Payer: Self-pay

## 2017-09-24 ENCOUNTER — Encounter (HOSPITAL_COMMUNITY): Payer: Self-pay | Admitting: *Deleted

## 2017-09-24 DIAGNOSIS — Z79899 Other long term (current) drug therapy: Secondary | ICD-10-CM | POA: Diagnosis not present

## 2017-09-24 DIAGNOSIS — N50812 Left testicular pain: Secondary | ICD-10-CM | POA: Diagnosis present

## 2017-09-24 MED ORDER — OXYCODONE-ACETAMINOPHEN 5-325 MG PO TABS
2.0000 | ORAL_TABLET | Freq: Once | ORAL | Status: AC
  Administered 2017-09-24: 2 via ORAL
  Filled 2017-09-24: qty 2

## 2017-09-24 NOTE — ED Notes (Signed)
Pt with left testicle for 2 weeks, denies pain with urination, denies penile discharge.  Pt states it feels swollen. Hx of same and dx with hydrocele.

## 2017-09-24 NOTE — ED Provider Notes (Signed)
[facility] EMERGENCY DEPARTMENT Provider Note   CSN: [id number] Arrival date & time: 09/24/17  2157     History   Chief Complaint Chief Complaint  Patient presents with  . Testicle Pain    HPI Lance Carter is a 32 y.o. male.  Patient presents with severe left testicular pain.  States it has been happening almost daily for the past 2 weeks but became more severe tonight all of a sudden.  States the pain is pretty constant but improves when he takes his chronic Percocet as well as Voltaren gel.  Denies any trauma or injury.  States he was diagnosed with a hydrocele several years ago while living in [place] and this pain feels similar.  He has not followed up with urology.  Denies any difficulty with urination or blood in the urine.  No fever or vomiting.  No penile discharge or abdominal pain.  His back pain is the same as always with chronic weakness in his left leg.  He takes Percocet on a regular basis for chronic back pain but this is not helping his testicular pain today.  He states he has been on vacation and more sexually active than usual.  The history is provided by the patient.  Testicle Pain  Pertinent negatives include no abdominal pain and no shortness of breath.    Past Medical History:  Diagnosis Date  . Chronic lower back pain 2015   with left lumbar radiculopathy  . History of chicken pox   . S/P discectomy for herniated nucleus pulposus 04/2014    Patient Active Problem List   Diagnosis Date Noted  . Chronic lower back pain   . S/P discectomy for herniated nucleus pulposus 04/26/2014    Past Surgical History:  Procedure Laterality Date  . INGUINAL HERNIA REPAIR     as child  . 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 Authorizing Provider  baclofen (LIORESAL) 10 MG tablet Take 10 mg by mouth daily as needed for muscle spasms.    Yes [provider]  diclofenac sodium (VOLTAREN) 1 % GEL Apply 1 application topically daily as needed. For pain 12/20/16 12/20/17 Yes [provider]  gabapentin (NEURONTIN) 300 MG capsule Take 300 mg by mouth 2 (two) times daily. May take 4 times daily as prescribed*   Yes [provider]  ibuprofen (ADVIL,MOTRIN) 200 MG tablet Take 400-600 mg by mouth every 6 (six) hours as needed for mild pain or moderate pain.   Yes [provider]  metaxalone (SKELAXIN) 800 MG tablet Take 800 mg by mouth 3 (three) times daily as needed. 08/15/17  Yes [provider]  oxyCODONE-acetaminophen (PERCOCET/ROXICET) 5-325 MG tablet Take 1 tablet by mouth 5 (five) times daily.  05/12/15  Yes [provider]  pramipexole (MIRAPEX) 0.25 MG tablet Take 0.25 mg by mouth at bedtime.    Yes [provider]    Family History Family History  Problem Relation Age of Onset  . Alcohol abuse Maternal Grandmother        cirrhosis, rest of maternal side  . Diabetes Paternal Grandfather     Social History Social History   Tobacco Use  . Smoking status: Never Smoker  . Smokeless tobacco: Never Used  Substance Use Topics  . Alcohol use: Yes    Alcohol/week: 0.0 oz  Comment: rarely  . Drug use: No     Allergies   Sulfa antibiotics   Review of Systems Review of Systems  Constitutional: Negative for activity change, appetite change and fever.  HENT: Negative for congestion and nosebleeds.   Eyes: Negative for visual disturbance.  Respiratory: Negative for cough, chest tightness and shortness of breath.   Gastrointestinal: Negative for abdominal pain, nausea and vomiting.  Genitourinary: Positive for testicular pain. Negative for dysuria, flank pain, penile pain and penile swelling.  Musculoskeletal: Negative for arthralgias and myalgias.  Skin: Negative for rash.  Neurological: Negative for dizziness and seizures.   all other systems are negative  except as noted in the HPI and PMH.     Physical Exam Updated Vital Signs BP (!) 147/90   Pulse 87   Temp 98.7 F (37.1 C) (Oral)   Resp 16   Ht 5\' 10"  (1.778 m)   Wt 102.1 kg (225 lb)   SpO2 96%   BMI 32.28 kg/m   Physical Exam  Constitutional: He is oriented to person, place, and time. He appears well-developed and well-nourished. No distress.  uncomfortable  HENT:  Head: Normocephalic and atraumatic.  Mouth/Throat: Oropharynx is clear and moist. No oropharyngeal exudate.  Eyes: Pupils are equal, round, and reactive to light. Conjunctivae and EOM are normal.  Neck: Normal range of motion. Neck supple.  No meningismus.  Cardiovascular: Normal rate, regular rhythm, normal heart sounds and intact distal pulses.  No murmur heard. Pulmonary/Chest: Effort normal and breath sounds normal. No respiratory distress.  Abdominal: Soft. There is no tenderness. There is no rebound and no guarding.  Genitourinary:  Genitourinary Comments: Left testicle is tender to palpation diffusely as well as over the epididymis.  There is no significant asymmetry, erythema or fluctuance.  Musculoskeletal: Normal range of motion. He exhibits no edema or tenderness.  Left leg weakness at baseline per patient.  Neurological: He is alert and oriented to person, place, and time. No cranial nerve deficit. He exhibits normal muscle tone. Coordination normal.  No ataxia on finger to nose bilaterally. No pronator drift. 5/5 strength throughout. CN 2-12 intact.Equal grip strength. Sensation intact.   Skin: Skin is warm.  Psychiatric: He has a normal mood and affect. His behavior is normal.  Nursing note and vitals reviewed.    ED Treatments / Results  Labs (all labs ordered are listed, but only abnormal results are displayed) Labs Reviewed  URINALYSIS, ROUTINE W REFLEX MICROSCOPIC - Abnormal; Notable for the following components:      Result Value   Color, Urine STRAW (*)    All other components within  normal limits    EKG None  Radiology [place] Scrotum W/doppler  Result Date: 09/25/2017 CLINICAL DATA:  Left testicle pain EXAM: SCROTAL ULTRASOUND DOPPLER ULTRASOUND OF THE TESTICLES TECHNIQUE: Complete ultrasound examination of the testicles, epididymis, and other scrotal structures was performed. Color and spectral Doppler ultrasound were also utilized to evaluate blood flow to the testicles. COMPARISON:  None. FINDINGS: Right testicle Measurements: 4.1 x 2.1 x 3 cm. No mass or microlithiasis visualized. Left testicle Measurements: 4.9 x 2.3 x 2.8 cm. Intratesticular cyst measuring 0.7 x 0.7 x 0.6 cm. Right epididymis:  Normal in size and appearance. Left epididymis:  Normal in size and appearance. Hydrocele:  Small bilateral hydroceles. Varicocele:  Small left varicocele. Pulsed Doppler interrogation of both testes demonstrates normal low resistance arterial and venous waveforms bilaterally. IMPRESSION: 1. Negative for testicular torsion 2. Small bilateral hydroceles.  Small left varicocele  3. 7 mm left intratesticular cyst Electronically Signed   By: Jasmine Pang M.D.   On: 09/25/2017 01:05    Procedures Procedures (including critical care time)  Medications Ordered in ED Medications  oxyCODONE-acetaminophen (PERCOCET/ROXICET) 5-325 MG per tablet 2 tablet (has no administration in time range)     Initial Impression / Assessment and Plan / ED Course  I have reviewed the triage vital signs and the nursing notes.  Pertinent labs & imaging results that were available during my care of the patient were reviewed by me and considered in my medical decision making (see chart for details).    2 weeks of left testicular pain that became suddenly worse tonight.  No difficulty with urination.   Urinalysis is negative.  Given sudden worsening of testicular pain, ultrasound was obtained.  This shows no evidence of testicular torsion.  Does show small bilateral hydroceles and small left  varicocele.  Patient instructed on anti-inflammatories, scrotal support, urology follow-up.  He is aware no additional pain medication can be prescribed given his chronic pain per medication prescriptions.  Follow-up with urology.  Return precautions discussed.  Final Clinical Impressions(s) / ED Diagnoses   Final diagnoses:  Left testicular pain    ED Discharge Orders    None       Aprile Dickenson, Jeannett Senior, MD 09/25/17 0206

## 2017-09-24 NOTE — ED Triage Notes (Signed)
Pt c/o testicle pain for the past two weeks, worse tonight, denies any injury, states that he had pain similar to this pain a year ago and was diagnosed with hydrocele

## 2017-09-25 ENCOUNTER — Emergency Department (HOSPITAL_COMMUNITY)

## 2017-09-25 LAB — URINALYSIS, ROUTINE W REFLEX MICROSCOPIC
Bilirubin Urine: NEGATIVE
GLUCOSE, UA: NEGATIVE mg/dL
Hgb urine dipstick: NEGATIVE
KETONES UR: NEGATIVE mg/dL
LEUKOCYTES UA: NEGATIVE
NITRITE: NEGATIVE
PH: 5 (ref 5.0–8.0)
PROTEIN: NEGATIVE mg/dL
Specific Gravity, Urine: 1.011 (ref 1.005–1.030)

## 2017-09-25 NOTE — ED Notes (Signed)
Pt taken for U/S

## 2017-09-25 NOTE — Discharge Instructions (Addendum)
Take the anti-inflammatories and your pain medication as prescribed.  Follow-up with the urologist.  Wear tight fitting underwear until follow-up with the urologist.  Return to the ED if you develop new or worsening symptoms.

## 2017-09-27 ENCOUNTER — Emergency Department (HOSPITAL_COMMUNITY)

## 2017-09-27 ENCOUNTER — Other Ambulatory Visit: Payer: Self-pay

## 2017-09-27 ENCOUNTER — Emergency Department (HOSPITAL_COMMUNITY)
Admission: EM | Admit: 2017-09-27 | Discharge: 2017-09-27 | Disposition: A | Discharge status: Home / Self Care | Attending: Emergency Medicine | Admitting: Emergency Medicine

## 2017-09-27 ENCOUNTER — Encounter (HOSPITAL_COMMUNITY): Payer: Self-pay | Admitting: Emergency Medicine

## 2017-09-27 DIAGNOSIS — Z79899 Other long term (current) drug therapy: Secondary | ICD-10-CM | POA: Insufficient documentation

## 2017-09-27 DIAGNOSIS — N50812 Left testicular pain: Secondary | ICD-10-CM | POA: Diagnosis present

## 2017-09-27 DIAGNOSIS — N50819 Testicular pain, unspecified: Secondary | ICD-10-CM

## 2017-09-27 LAB — COMPREHENSIVE METABOLIC PANEL
ALT: 42 U/L (ref 17–63)
AST: 28 U/L (ref 15–41)
Albumin: 4.6 g/dL (ref 3.5–5.0)
Alkaline Phosphatase: 57 U/L (ref 38–126)
Anion gap: 14 (ref 5–15)
BUN: 10 mg/dL (ref 6–20)
CO2: 22 mmol/L (ref 22–32)
Calcium: 9.7 mg/dL (ref 8.9–10.3)
Chloride: 102 mmol/L (ref 101–111)
Creatinine, Ser: 1.04 mg/dL (ref 0.61–1.24)
GFR calc Af Amer: >60 mL/min (ref >60)
GFR calc non Af Amer: >60 mL/min (ref >60)
Glucose, Bld: 130 mg/dL — ABNORMAL HIGH (ref 65–99)
Potassium: 3.6 mmol/L (ref 3.5–5.1)
Sodium: 138 mmol/L (ref 135–145)
Total Bilirubin: 0.8 mg/dL (ref 0.3–1.2)
Total Protein: 6.8 g/dL (ref 6.5–8.1)

## 2017-09-27 LAB — URINALYSIS, ROUTINE W REFLEX MICROSCOPIC
Bilirubin Urine: NEGATIVE
Glucose, UA: NEGATIVE mg/dL
Hgb urine dipstick: NEGATIVE
Ketones, ur: NEGATIVE mg/dL
Leukocytes, UA: NEGATIVE
Nitrite: NEGATIVE
Protein, ur: NEGATIVE mg/dL
Specific Gravity, Urine: 1.01 (ref 1.005–1.030)
pH: 7 (ref 5.0–8.0)

## 2017-09-27 LAB — CBC WITH DIFFERENTIAL/PLATELET
Basophils Absolute: 0 10*3/uL (ref 0.0–0.1)
Basophils Relative: 0 %
Eosinophils Absolute: 0.1 10*3/uL (ref 0.0–0.7)
Eosinophils Relative: 2 %
HCT: 43.5 % (ref 39.0–52.0)
Hemoglobin: 15.5 g/dL (ref 13.0–17.0)
Lymphocytes Relative: 31 %
Lymphs Abs: 2.2 10*3/uL (ref 0.7–4.0)
MCH: 30.3 pg (ref 26.0–34.0)
MCHC: 35.6 g/dL (ref 30.0–36.0)
MCV: 85.1 fL (ref 78.0–100.0)
Monocytes Absolute: 0.5 10*3/uL (ref 0.1–1.0)
Monocytes Relative: 6 %
Neutro Abs: 4.4 10*3/uL (ref 1.7–7.7)
Neutrophils Relative %: 61 %
Platelets: 294 10*3/uL (ref 150–400)
RBC: 5.11 MIL/uL (ref 4.22–5.81)
RDW: 11.8 % (ref 11.5–15.5)
WBC: 7.2 10*3/uL (ref 4.0–10.5)

## 2017-09-27 MED ORDER — OXYCODONE-ACETAMINOPHEN 5-325 MG PO TABS
1.0000 | ORAL_TABLET | Freq: Once | ORAL | Status: AC
  Administered 2017-09-27: 1 via ORAL
  Filled 2017-09-27: qty 1

## 2017-09-27 MED ORDER — FENTANYL CITRATE (PF) 100 MCG/2ML IJ SOLN
50.0000 ug | Freq: Once | INTRAMUSCULAR | Status: AC
  Administered 2017-09-27: 50 ug via INTRAVENOUS

## 2017-09-27 MED ORDER — HYDROMORPHONE HCL 1 MG/ML IJ SOLN
0.5000 mg | Freq: Once | INTRAMUSCULAR | Status: AC
Start: 2017-09-27 — End: 2017-09-27
  Administered 2017-09-27: 0.5 mg via INTRAVENOUS
  Filled 2017-09-27: qty 1

## 2017-09-27 MED ORDER — FENTANYL CITRATE (PF) 100 MCG/2ML IJ SOLN
50.0000 ug | Freq: Once | INTRAMUSCULAR | Status: DC
Start: 2017-09-27 — End: 2017-09-27
  Filled 2017-09-27: qty 2

## 2017-09-27 MED ORDER — DOXYCYCLINE HYCLATE 100 MG PO CAPS: 100.0000 mg | ORAL_CAPSULE | Freq: Two times a day (BID) | ORAL | 0 refills | Status: DC

## 2017-09-27 MED ORDER — IOPAMIDOL (ISOVUE-300) INJECTION 61%
100.0000 mL | Freq: Once | INTRAVENOUS | Status: AC | PRN
  Administered 2017-09-27: 100 mL via INTRAVENOUS

## 2017-09-27 MED ORDER — FENTANYL CITRATE (PF) 100 MCG/2ML IJ SOLN
50.0000 ug | Freq: Once | INTRAMUSCULAR | Status: AC
  Administered 2017-09-27: 50 ug via INTRAVENOUS
  Filled 2017-09-27: qty 2

## 2017-09-27 MED ORDER — ONDANSETRON HCL 4 MG/2ML IJ SOLN
4.0000 mg | Freq: Once | INTRAMUSCULAR | Status: AC
  Administered 2017-09-27: 4 mg via INTRAVENOUS
  Filled 2017-09-27: qty 2

## 2017-09-27 MED ORDER — FENTANYL CITRATE (PF) 100 MCG/2ML IJ SOLN
50.0000 ug | INTRAMUSCULAR | Status: DC | PRN
  Administered 2017-09-27: 50 ug via INTRAVENOUS
  Filled 2017-09-27: qty 2

## 2017-09-27 MED ORDER — FENTANYL CITRATE (PF) 100 MCG/2ML IJ SOLN
50.0000 ug | INTRAMUSCULAR | Status: DC | PRN
Start: 2017-09-27 — End: 2017-09-27

## 2017-09-27 MED ORDER — CEFTRIAXONE SODIUM 250 MG IJ SOLR
250.0000 mg | Freq: Once | INTRAMUSCULAR | Status: AC
  Administered 2017-09-27: 250 mg via INTRAMUSCULAR
  Filled 2017-09-27: qty 250

## 2017-09-27 MED ORDER — IOPAMIDOL (ISOVUE-300) INJECTION 61%
INTRAVENOUS | Status: AC
  Filled 2017-09-27: qty 100

## 2017-09-27 MED ORDER — LIDOCAINE HCL (PF) 1 % IJ SOLN
5.0000 mL | Freq: Once | INTRAMUSCULAR | Status: AC
  Administered 2017-09-27: 5 mL via INTRADERMAL
  Filled 2017-09-27: qty 5

## 2017-09-27 NOTE — ED Notes (Signed)
PA at bedside at this time.  

## 2017-09-27 NOTE — ED Provider Notes (Signed)
[facility] EMERGENCY DEPARTMENT Provider Note   CSN: [id number] Arrival date & time: 09/27/17  1548     History   Chief Complaint Chief Complaint  Patient presents with  . Testicle Pain    HPI Lance Carter is a 32 y.o. male.  HPI   32 year old male presents today with complaints of testicular pain.  Patient notes approximately 1 month history of left testicular and groin pain.  Patient notes that he was seen in the emergency room on 09/24/2017 approximately 3 days ago with the same.  He had an ultrasound urinalysis performed which showed no significant etiology.  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  Patient notes the symptoms acutely worsened again today.  Patient notes that this is located in the left upper portion of the testicle and groin, he denies any dysuria, penile discharge, significant abdominal pain, fever, nausea or vomiting.  Patient notes a history of chronic back pain and takes Percocet but notes this does not improve his symptoms.  She reports he is sexually active with one partner no history of STD, no concern for STD.  He reports no pain to his right testicle.  Patient does note that he had a hernia as a child but is on certain as to where this was at.  Patient denies any significant swelling of the left testicle.   Past Medical History:  Diagnosis Date  . Chronic lower back pain 2015   with left lumbar radiculopathy  . History of chicken pox   . S/P discectomy for herniated nucleus pulposus 04/2014    Patient Active Problem List   Diagnosis Date Noted  . Chronic lower back pain   . S/P discectomy for herniated nucleus pulposus 04/26/2014    Past Surgical History:  Procedure Laterality Date  . INGUINAL HERNIA REPAIR     as child  . 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 Authorizing Provider  baclofen (LIORESAL) 10 MG tablet Take 10 mg by mouth daily as needed for muscle spasms.    [provider]  diclofenac sodium (VOLTAREN) 1 % GEL Apply 1 application topically daily as needed. For pain 12/20/16 12/20/17  [provider]  doxycycline (VIBRAMYCIN) 100 MG capsule Take 1 capsule (100 mg total) by mouth 2 (two) times daily. 09/27/17   Minnie Shi, Tinnie Gens, PA-C  gabapentin (NEURONTIN) 300 MG capsule Take 300 mg by mouth 2 (two) times daily. May take 4 times daily as prescribed*    [provider]  ibuprofen (ADVIL,MOTRIN) 200 MG tablet Take 400-600 mg by mouth every 6 (six) hours as needed for mild pain or moderate pain.    [provider]  metaxalone (SKELAXIN) 800 MG tablet Take 800 mg by mouth 3 (three) times daily as needed. 08/15/17   [provider]  oxyCODONE-acetaminophen (PERCOCET/ROXICET) 5-325 MG tablet Take 1 tablet by mouth 5 (five) times daily.  05/12/15   [provider]  pramipexole (MIRAPEX) 0.25 MG tablet Take 0.25 mg by mouth at bedtime.     [provider]    Family History Family History  Problem Relation Age of Onset  . Alcohol abuse Maternal Grandmother        cirrhosis, rest of maternal side  . Diabetes Paternal Grandfather  Social History Social History   Tobacco Use  . Smoking status: Never Smoker  . Smokeless tobacco: Never Used  Substance Use Topics  . Alcohol use: Yes    Alcohol/week: 0.0 oz    Comment: rarely  . Drug use: No     Allergies   Sulfa antibiotics   Review of Systems Review of Systems  All other systems reviewed and are negative.    Physical Exam Updated Vital Signs BP (!) 179/105 (BP Location: Right Arm)   Pulse (!) 115   Resp 20   SpO2 95%   Physical Exam  Constitutional: He is oriented to person, place, and time. He appears well-developed and well-nourished.  HENT:  Head:  Normocephalic and atraumatic.  Eyes: Pupils are equal, round, and reactive to light. Conjunctivae are normal. Right eye exhibits no discharge. Left eye exhibits no discharge. No scleral icterus.  Neck: Normal range of motion. No JVD present. No tracheal deviation present.  Pulmonary/Chest: Effort normal. No stridor.  Abdominal: Soft. He exhibits no distension and no mass. There is no tenderness. There is no rebound and no guarding. No hernia.  Genitourinary:  Genitourinary Comments: Circumcised penis, left testicle tenderness palpation on the superior posterior aspect, exquisite tenderness with inspection of the inguinal canal, no obvious hernia noted-right testicle nontender descended scrotum without swelling or edema, no erythema  Neurological: He is alert and oriented to person, place, and time. Coordination normal.  Psychiatric: He has a normal mood and affect. His behavior is normal. Judgment and thought content normal.  Nursing note and vitals reviewed.   ED Treatments / Results  Labs (all labs ordered are listed, but only abnormal results are displayed) Labs Reviewed  COMPREHENSIVE METABOLIC PANEL - Abnormal; Notable for the following components:      Result Value   Glucose, Bld 130 (*)    All other components within normal limits  URINALYSIS, ROUTINE W REFLEX MICROSCOPIC - Abnormal; Notable for the following components:   Color, Urine STRAW (*)    All other components within normal limits  CBC WITH DIFFERENTIAL/PLATELET  GC/CHLAMYDIA PROBE AMP ([place]) NOT AT [facility]    EKG None  Radiology Ct Abdomen Pelvis W Contrast  Result Date: 09/27/2017 CLINICAL DATA:  Testicular pain, left testicular pain for 1 month EXAM: CT ABDOMEN AND PELVIS WITH CONTRAST TECHNIQUE: Multidetector CT imaging of the abdomen and pelvis was performed using the standard protocol following bolus administration of intravenous contrast. CONTRAST:  ISOVUE-300 IOPAMIDOL (ISOVUE-300) INJECTION 61%  COMPARISON:  None. FINDINGS: Lower chest: Bibasilar atelectasis. Hepatobiliary: No focal liver abnormality is seen. No gallstones, gallbladder wall thickening, or biliary dilatation. Pancreas: Unremarkable. No pancreatic ductal dilatation or surrounding inflammatory changes. Spleen: Normal in size without focal abnormality. Adrenals/Urinary Tract: Adrenal glands are unremarkable. Kidneys are normal, without renal calculi, focal lesion, or hydronephrosis. Bladder is unremarkable. Stomach/Bowel: Stomach is within normal limits. Appendix appears normal. No evidence of bowel wall thickening, distention, or inflammatory changes. Diverticulosis of the sigmoid colon without evidence of diverticulitis. Vascular/Lymphatic: Aortic atherosclerosis. No enlarged abdominal or pelvic lymph nodes. Reproductive: Prostate is unremarkable. Other: No abdominal wall hernia or abnormality. No abdominopelvic ascites. Musculoskeletal: No acute or significant osseous findings. 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 IMPRESSION: 1. No acute abdominal or pelvic pathology. Electronically Signed   By: Elige Ko   On: 09/27/2017 17:44   US Scrotum W/doppler  Result Date: 09/27/2017 CLINICAL DATA:  Testicular pain. EXAM: SCROTAL ULTRASOUND DOPPLER ULTRASOUND OF THE  TESTICLES TECHNIQUE: Complete ultrasound examination of the testicles, epididymis, and other scrotal structures was performed. Color and spectral Doppler ultrasound were also utilized to evaluate blood flow to the testicles. COMPARISON:  09/25/2017 FINDINGS: Right testicle Measurements: 4 x 2 x 2.5 cm.  No mass or microlithiasis visualized. Left testicle Measurements: 4.5 x 2 x 3.1 cm. 5.7 x 6.3 x 6.2 mm anechoic left testicular mass most consistent with a small cyst. No solid mass or microlithiasis visualized. Right epididymis:  Normal in size and appearance. Left epididymis:  Normal in size and appearance. Hydrocele:  Trace  bilateral hydroceles. Varicocele:  Small left varicocele. Pulsed Doppler interrogation of both testes demonstrates normal low resistance arterial and venous waveforms bilaterally. IMPRESSION: 1. No testicular torsion. 2. Small left varicocele. Electronically Signed   By: Elige KoHetal  Patel   On: 09/27/2017 19:36    Procedures Procedures (including critical care time)  Medications Ordered in ED Medications  iopamidol (ISOVUE-300) 61 % injection (has no administration in time range)  fentaNYL (SUBLIMAZE) injection 50 mcg (has no administration in time range)  cefTRIAXone (ROCEPHIN) injection 250 mg (has no administration in time range)  lidocaine (PF) (XYLOCAINE) 1 % injection 5 mL (has no administration in time range)  HYDROmorphone (DILAUDID) injection 0.5 mg (0.5 mg Intravenous Given 09/27/17 1617)  iopamidol (ISOVUE-300) 61 % injection 100 mL (100 mLs Intravenous Contrast Given 09/27/17 1721)  fentaNYL (SUBLIMAZE) injection 50 mcg (50 mcg Intravenous Given 09/27/17 1735)  ondansetron (ZOFRAN) injection 4 mg (4 mg Intravenous Given 09/27/17 1744)  fentaNYL (SUBLIMAZE) injection 50 mcg (50 mcg Intravenous Given 09/27/17 1941)     Initial Impression / Assessment and Plan / ED Course  I have reviewed the triage vital signs and the nursing notes.  Pertinent labs & imaging results that were available during my care of the patient were reviewed by me and considered in my medical decision making (see chart for details).      Final Clinical Impressions(s) / ED Diagnoses   Final diagnoses:  Testicular pain, left  Testicular pain    Labs: Urinalysis, CBC CMP  Imaging: Ultrasound scrotum, CT abdomen pelvis with contrast  Consults:  Therapeutics: Dilaudid, fentanyl, ceftriaxone  Discharge Meds: Doxycycline  Assessment/Plan: 32 year old male presents today with complaints of testicular pain uncertain etiology at this time.  This is patient's third evaluation including 2 ED visits and one urology  visit.  Patient is acutely uncomfortable in the exam bed.  He has a reassuring evaluation with negative CT, no significant findings on ultrasound and laboratory analysis without significant findings.  Clinically his symptoms are most consistent with epididymitis although these are not backed up with objective evidence here.  Due to patient's ongoing discomfort I discussed treatment with antibiotics for presumed epididymitis, patient would like to proceed with antibiotics.  He will be treated with ceftriaxone and doxycycline at home.  Patient does have a follow-up appointment with another urologist I encouraged him to keep this appointment if symptoms persist he can return to the emergency room at any time with any new or worsening signs or symptoms.  I have very low suspicion for malignancy, hernia, or any other significant etiology at this time.  Patient verbalized understanding and agreement to today's plan and had no further questions or concerns.  Patient will be driven home by his wife here tonight.     ED Discharge Orders        Ordered    doxycycline (VIBRAMYCIN) 100 MG capsule  2 times daily  09/27/17 2045       Eyvonne Mechanic, PA-C 09/27/17 2049    Pricilla Loveless, MD 09/27/17 [phone]

## 2017-09-27 NOTE — Discharge Instructions (Signed)
Please read attached information. If you experience any new or worsening signs or symptoms please return to the emergency room for evaluation. Please follow-up with your primary care provider or specialist as discussed. Please use medication prescribed only as directed and discontinue taking if you have any concerning signs or symptoms.   °

## 2017-09-27 NOTE — ED Triage Notes (Signed)
Patient presents to ED for assessment left testicular pain (with some to right) for a while now, seen 2 days ago for same, pain is increasing.

## 2017-09-28 ENCOUNTER — Inpatient Hospital Stay (HOSPITAL_COMMUNITY)
Admission: EM | Admit: 2017-09-28 | Discharge: 2017-10-07 | DRG: 730 | Disposition: A | Discharge status: Home / Self Care | Attending: Internal Medicine | Admitting: Internal Medicine

## 2017-09-28 ENCOUNTER — Other Ambulatory Visit: Payer: Self-pay

## 2017-09-28 ENCOUNTER — Encounter (HOSPITAL_COMMUNITY): Payer: Self-pay

## 2017-09-28 DIAGNOSIS — T402X5A Adverse effect of other opioids, initial encounter: Secondary | ICD-10-CM | POA: Diagnosis present

## 2017-09-28 DIAGNOSIS — F419 Anxiety disorder, unspecified: Secondary | ICD-10-CM | POA: Diagnosis present

## 2017-09-28 DIAGNOSIS — N50812 Left testicular pain: Secondary | ICD-10-CM | POA: Diagnosis present

## 2017-09-28 DIAGNOSIS — R208 Other disturbances of skin sensation: Secondary | ICD-10-CM | POA: Diagnosis present

## 2017-09-28 DIAGNOSIS — I10 Essential (primary) hypertension: Secondary | ICD-10-CM | POA: Diagnosis present

## 2017-09-28 DIAGNOSIS — M545 Low back pain: Secondary | ICD-10-CM

## 2017-09-28 DIAGNOSIS — N442 Benign cyst of testis: Secondary | ICD-10-CM | POA: Diagnosis not present

## 2017-09-28 DIAGNOSIS — N433 Hydrocele, unspecified: Secondary | ICD-10-CM | POA: Diagnosis present

## 2017-09-28 DIAGNOSIS — F329 Major depressive disorder, single episode, unspecified: Secondary | ICD-10-CM | POA: Diagnosis not present

## 2017-09-28 DIAGNOSIS — N50819 Testicular pain, unspecified: Secondary | ICD-10-CM

## 2017-09-28 DIAGNOSIS — Z79891 Long term (current) use of opiate analgesic: Secondary | ICD-10-CM

## 2017-09-28 DIAGNOSIS — Z79899 Other long term (current) drug therapy: Secondary | ICD-10-CM | POA: Diagnosis not present

## 2017-09-28 DIAGNOSIS — R52 Pain, unspecified: Secondary | ICD-10-CM | POA: Diagnosis present

## 2017-09-28 DIAGNOSIS — I861 Scrotal varices: Secondary | ICD-10-CM | POA: Diagnosis not present

## 2017-09-28 DIAGNOSIS — G8929 Other chronic pain: Secondary | ICD-10-CM | POA: Diagnosis present

## 2017-09-28 LAB — I-STAT CHEM 8, ED
BUN: 11 mg/dL (ref 6–20)
CALCIUM ION: 1.12 mmol/L — AB (ref 1.15–1.40)
CHLORIDE: 104 mmol/L (ref 101–111)
Creatinine, Ser: 1.1 mg/dL (ref 0.61–1.24)
GLUCOSE: 107 mg/dL — AB (ref 65–99)
HCT: 46 % (ref 39.0–52.0)
Hemoglobin: 15.6 g/dL (ref 13.0–17.0)
POTASSIUM: 3.7 mmol/L (ref 3.5–5.1)
Sodium: 140 mmol/L (ref 135–145)
TCO2: 24 mmol/L (ref 22–32)

## 2017-09-28 LAB — URINALYSIS, ROUTINE W REFLEX MICROSCOPIC
Bilirubin Urine: NEGATIVE
Glucose, UA: NEGATIVE mg/dL
HGB URINE DIPSTICK: NEGATIVE
Ketones, ur: NEGATIVE mg/dL
LEUKOCYTES UA: NEGATIVE
NITRITE: NEGATIVE
PROTEIN: NEGATIVE mg/dL
SPECIFIC GRAVITY, URINE: 1.013 (ref 1.005–1.030)
pH: 8 (ref 5.0–8.0)

## 2017-09-28 LAB — CBC WITH DIFFERENTIAL/PLATELET
BASOS ABS: 0 10*3/uL (ref 0.0–0.1)
Basophils Relative: 0 %
Eosinophils Absolute: 0.1 10*3/uL (ref 0.0–0.7)
Eosinophils Relative: 1 %
HEMATOCRIT: 45.1 % (ref 39.0–52.0)
HEMOGLOBIN: 16.1 g/dL (ref 13.0–17.0)
LYMPHS PCT: 17 %
Lymphs Abs: 1.5 10*3/uL (ref 0.7–4.0)
MCH: 30.4 pg (ref 26.0–34.0)
MCHC: 35.7 g/dL (ref 30.0–36.0)
MCV: 85.1 fL (ref 78.0–100.0)
Monocytes Absolute: 0.4 10*3/uL (ref 0.1–1.0)
Monocytes Relative: 5 %
NEUTROS ABS: 6.7 10*3/uL (ref 1.7–7.7)
NEUTROS PCT: 77 %
PLATELETS: 288 10*3/uL (ref 150–400)
RBC: 5.3 MIL/uL (ref 4.22–5.81)
RDW: 11.9 % (ref 11.5–15.5)
WBC: 8.8 10*3/uL (ref 4.0–10.5)

## 2017-09-28 LAB — SEDIMENTATION RATE: Sed Rate: 1 mm/hr (ref 0–16)

## 2017-09-28 LAB — GC/CHLAMYDIA PROBE AMP (~~LOC~~) NOT AT ARMC
Chlamydia: NEGATIVE
Neisseria Gonorrhea: NEGATIVE

## 2017-09-28 MED ORDER — KETAMINE HCL 10 MG/ML IJ SOLN
0.3000 mg/kg | Freq: Once | INTRAMUSCULAR | Status: AC
  Administered 2017-09-28: 31 mg via INTRAVENOUS
  Filled 2017-09-28: qty 1

## 2017-09-28 MED ORDER — ONDANSETRON HCL 4 MG/2ML IJ SOLN
4.0000 mg | Freq: Once | INTRAMUSCULAR | Status: AC
  Administered 2017-09-28: 4 mg via INTRAVENOUS
  Filled 2017-09-28: qty 2

## 2017-09-28 MED ORDER — GABAPENTIN 300 MG PO CAPS
300.0000 mg | ORAL_CAPSULE | Freq: Three times a day (TID) | ORAL | Status: DC
  Administered 2017-09-28 – 2017-09-29 (×2): 300 mg via ORAL
  Filled 2017-09-28 (×2): qty 1

## 2017-09-28 MED ORDER — GABAPENTIN 100 MG PO CAPS
200.0000 mg | ORAL_CAPSULE | Freq: Once | ORAL | Status: AC
  Administered 2017-09-28: 200 mg via ORAL
  Filled 2017-09-28: qty 2

## 2017-09-28 MED ORDER — DOXYCYCLINE HYCLATE 100 MG PO TABS
100.0000 mg | ORAL_TABLET | Freq: Two times a day (BID) | ORAL | Status: DC
  Administered 2017-09-28 – 2017-10-02 (×8): 100 mg via ORAL
  Filled 2017-09-28 (×8): qty 1

## 2017-09-28 MED ORDER — BISACODYL 5 MG PO TBEC
5.0000 mg | DELAYED_RELEASE_TABLET | Freq: Every day | ORAL | Status: DC | PRN
  Administered 2017-10-01: 5 mg via ORAL
  Filled 2017-09-28: qty 1

## 2017-09-28 MED ORDER — ONDANSETRON HCL 4 MG/2ML IJ SOLN: 4.0000 mg | Freq: Four times a day (QID) | INTRAMUSCULAR | Status: DC | PRN

## 2017-09-28 MED ORDER — ONDANSETRON 4 MG PO TBDP
4.0000 mg | ORAL_TABLET | Freq: Once | ORAL | Status: AC
  Administered 2017-09-28: 4 mg via ORAL
  Filled 2017-09-28: qty 1

## 2017-09-28 MED ORDER — SODIUM CHLORIDE 0.9% FLUSH: 9.0000 mL | INTRAVENOUS | Status: DC | PRN

## 2017-09-28 MED ORDER — HYDROMORPHONE HCL 1 MG/ML IJ SOLN
1.0000 mg | Freq: Once | INTRAMUSCULAR | Status: AC
  Administered 2017-09-28: 1 mg via INTRAVENOUS
  Filled 2017-09-28: qty 1

## 2017-09-28 MED ORDER — ACETAMINOPHEN 325 MG PO TABS
650.0000 mg | ORAL_TABLET | Freq: Four times a day (QID) | ORAL | Status: DC | PRN
  Administered 2017-09-30 – 2017-10-03 (×3): 650 mg via ORAL
  Filled 2017-09-28 (×3): qty 2

## 2017-09-28 MED ORDER — DIPHENHYDRAMINE HCL 12.5 MG/5ML PO ELIX: 12.5000 mg | ORAL_SOLUTION | Freq: Four times a day (QID) | ORAL | Status: DC | PRN

## 2017-09-28 MED ORDER — KETOROLAC TROMETHAMINE 30 MG/ML IJ SOLN
30.0000 mg | Freq: Once | INTRAMUSCULAR | Status: AC
Start: 2017-09-28 — End: 2017-09-28
  Administered 2017-09-28: 30 mg via INTRAVENOUS
  Filled 2017-09-28: qty 1

## 2017-09-28 MED ORDER — BACLOFEN 10 MG PO TABS
10.0000 mg | ORAL_TABLET | Freq: Every day | ORAL | Status: DC | PRN
  Administered 2017-09-28 – 2017-09-30 (×3): 10 mg via ORAL
  Filled 2017-09-28 (×6): qty 1

## 2017-09-28 MED ORDER — MORPHINE SULFATE (PF) 4 MG/ML IV SOLN
4.0000 mg | Freq: Once | INTRAVENOUS | Status: AC
  Administered 2017-09-28: 4 mg via INTRAVENOUS

## 2017-09-28 MED ORDER — NALOXONE HCL 0.4 MG/ML IJ SOLN: 0.4000 mg | INTRAMUSCULAR | Status: DC | PRN

## 2017-09-28 MED ORDER — FENTANYL CITRATE (PF) 100 MCG/2ML IJ SOLN
50.0000 ug | Freq: Once | INTRAMUSCULAR | Status: AC
  Administered 2017-09-28: 50 ug via INTRAVENOUS
  Filled 2017-09-28: qty 2

## 2017-09-28 MED ORDER — DIPHENHYDRAMINE HCL 50 MG/ML IJ SOLN: 12.5000 mg | Freq: Four times a day (QID) | INTRAMUSCULAR | Status: DC | PRN

## 2017-09-28 MED ORDER — METAXALONE 800 MG PO TABS
800.0000 mg | ORAL_TABLET | Freq: Three times a day (TID) | ORAL | Status: DC | PRN
  Administered 2017-09-30: 800 mg via ORAL
  Filled 2017-09-28: qty 1

## 2017-09-28 MED ORDER — ONDANSETRON HCL 4 MG PO TABS: 4.0000 mg | ORAL_TABLET | Freq: Four times a day (QID) | ORAL | Status: DC | PRN

## 2017-09-28 MED ORDER — OXYCODONE-ACETAMINOPHEN 5-325 MG PO TABS
1.0000 | ORAL_TABLET | Freq: Four times a day (QID) | ORAL | Status: DC | PRN
  Administered 2017-09-28: 2 via ORAL
  Filled 2017-09-28: qty 2

## 2017-09-28 MED ORDER — MORPHINE SULFATE (PF) 4 MG/ML IV SOLN
INTRAVENOUS | Status: AC
  Filled 2017-09-28: qty 1

## 2017-09-28 MED ORDER — KETOROLAC TROMETHAMINE 30 MG/ML IJ SOLN
30.0000 mg | Freq: Four times a day (QID) | INTRAMUSCULAR | Status: DC
  Administered 2017-09-28 – 2017-10-01 (×11): 30 mg via INTRAVENOUS
  Filled 2017-09-28 (×12): qty 1

## 2017-09-28 MED ORDER — FAMOTIDINE IN NACL 20-0.9 MG/50ML-% IV SOLN
20.0000 mg | Freq: Two times a day (BID) | INTRAVENOUS | Status: DC
  Administered 2017-09-28: 20 mg via INTRAVENOUS
  Filled 2017-09-28 (×2): qty 50

## 2017-09-28 MED ORDER — SENNOSIDES-DOCUSATE SODIUM 8.6-50 MG PO TABS: 1.0000 | ORAL_TABLET | Freq: Every evening | ORAL | Status: DC | PRN

## 2017-09-28 MED ORDER — ACETAMINOPHEN 650 MG RE SUPP: 650.0000 mg | Freq: Four times a day (QID) | RECTAL | Status: DC | PRN

## 2017-09-28 MED ORDER — ONDANSETRON HCL 4 MG/2ML IJ SOLN
4.0000 mg | Freq: Four times a day (QID) | INTRAMUSCULAR | Status: DC | PRN
  Administered 2017-09-29 – 2017-10-06 (×8): 4 mg via INTRAVENOUS
  Filled 2017-09-28 (×9): qty 2

## 2017-09-28 MED ORDER — ENOXAPARIN SODIUM 40 MG/0.4ML ~~LOC~~ SOLN
40.0000 mg | SUBCUTANEOUS | Status: DC
  Administered 2017-09-29 – 2017-10-06 (×8): 40 mg via SUBCUTANEOUS
  Filled 2017-09-28 (×8): qty 0.4

## 2017-09-28 MED ORDER — HYDROMORPHONE HCL 2 MG PO TABS
4.0000 mg | ORAL_TABLET | ORAL | Status: DC | PRN
  Administered 2017-09-28: 4 mg via ORAL
  Filled 2017-09-28: qty 2

## 2017-09-28 MED ORDER — PRAMIPEXOLE DIHYDROCHLORIDE 0.25 MG PO TABS
0.2500 mg | ORAL_TABLET | Freq: Every day | ORAL | Status: DC
  Administered 2017-09-29 – 2017-10-06 (×8): 0.25 mg via ORAL
  Filled 2017-09-28 (×8): qty 1

## 2017-09-28 MED ORDER — FENTANYL 40 MCG/ML IV SOLN
INTRAVENOUS | Status: DC
  Administered 2017-09-29: 40 ug via INTRAVENOUS
  Administered 2017-09-29: 90 ug via INTRAVENOUS
  Administered 2017-09-29: 1000 ug via INTRAVENOUS
  Administered 2017-09-29: 60 ug via INTRAVENOUS
  Administered 2017-09-29: 255 ug via INTRAVENOUS
  Filled 2017-09-28: qty 25

## 2017-09-28 NOTE — ED Provider Notes (Signed)
[facility] EMERGENCY [facility]PARTMENT Provider Note   CSN: [id number] Arrival date & time: 09/28/17  1542     History   Chief Complaint Chief Complaint  Patient presents with  . Testicle Pain    HPI Lance Carter is a 32 y.o. male.  HPI   32 year old male presenting for recurrent worsening testicular pain.  Patient has an approximate 1 month history of left testicle and groin pain.  He has been seen and evaluated several times in the past for the same complaint most recent visit was yesterday.Marland Kitchen.  He was also seen at the emergency room on 09/24/2017 for the same.  He has had abdominal pelvis CT scan and testicular scrotal ultrasound without acute finding.  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  He does have history of chronic lower back pain that he takes Percocet but report no improvement of symptoms of his testicle pain with Percocet.  During his visit from yesterday, patient was prescribed antibiotic to treat for potential epididymitis.  Patient treated with doxycycline and ceftriaxone.  His pain is mildly improved when discharged yesterday but pain has returned and has been more intense.  Described as sharp burning sensation, very localized right above his left testicle only and does not radiates to his back.  He felt nauseous without vomiting.  Rates pain as 10 out of 10.  No abdominal pain, no burning urination no blood in his urine.  Past Medical History:  Diagnosis Date  . Chronic lower back pain 2015   with left lumbar radiculopathy  . History of chicken pox   . S/P discectomy for herniated nucleus pulposus 04/2014    Patient Active Problem List   Diagnosis Date Noted  . Chronic lower back pain   . S/P discectomy for herniated nucleus pulposus 04/26/2014    Past Surgical History:  Procedure Laterality Date  . INGUINAL HERNIA REPAIR     as child  . 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 Authorizing Provider  baclofen (LIORESAL) 10 MG tablet Take 10 mg by mouth daily as needed for muscle spasms.    [provider]  diclofenac sodium (VOLTAREN) 1 % GEL Apply 1 application topically daily as needed. For pain 12/20/16 12/20/17  [provider]  doxycycline (VIBRAMYCIN) 100 MG capsule Take 1 capsule (100 mg total) by mouth 2 (two) times daily. 09/27/17   Hedges, Tinnie GensJeffrey, PA-C  gabapentin (NEURONTIN) 300 MG capsule Take 300 mg by mouth 2 (two) times daily. May take 4 times daily as prescribed*    [provider]  ibuprofen (ADVIL,MOTRIN) 200 MG tablet Take 400-600 mg by mouth every 6 (six) hours as needed for mild pain or moderate pain.    [provider]  metaxalone (SKELAXIN) 800 MG tablet Take 800 mg by mouth 3 (three) times daily as needed. 08/15/17   [provider]  oxyCODONE-acetaminophen (PERCOCET/ROXICET) 5-325 MG tablet Take 1 tablet by mouth 5 (five) times daily.  05/12/15   [provider]  pramipexole (MIRAPEX) 0.25 MG tablet Take 0.25 mg by mouth at bedtime.     [provider]    Family History Family History  Problem Relation Age of Onset  . Alcohol abuse Maternal Grandmother  cirrhosis, rest of maternal side  . Diabetes Paternal Grandfather     Social History Social History   Tobacco Use  . Smoking status: Never Smoker  . Smokeless tobacco: Never Used  Substance Use Topics  . Alcohol use: Yes    Alcohol/week: 0.0 oz    Comment: rarely  . Drug use: No     Allergies   Sulfa antibiotics   Review of Systems Review of Systems  All other systems reviewed and are negative.    Physical Exam Updated Vital Signs BP (!) 159/92 (BP Location: Right Arm)   Pulse 85   Temp 98 F (36.7 C) (Oral)   Resp 16   Ht 5\' 10"  (1.778 m)   Wt 102.1 kg (225 lb)    SpO2 98%   BMI 32.28 kg/m   Physical Exam  Constitutional: He appears well-developed and well-nourished. He appears distressed (Appears uncomfortable laying in bed).  HENT:  Head: Atraumatic.  Eyes: Conjunctivae are normal.  Neck: Neck supple.  Cardiovascular: Normal rate and regular rhythm.  Pulmonary/Chest: Effort normal and breath sounds normal.  Abdominal: Soft. He exhibits no distension. There is no tenderness.  Genitourinary: Penis normal.  Genitourinary Comments: Chaperone present during exam.  Circumcised penis free of lesion or rash.  Normal-appearing scrotum.  Testicle with normal lie.  Exquisite tenderness to left epididymal region on palpation with intact cremasteric reflex.  Perineum soft.  No rash or lesion.  Neurological: He is alert.  Skin: No rash noted.  Psychiatric: He has a normal mood and affect.  Nursing note and vitals reviewed.    ED Treatments / Results  Labs (all labs ordered are listed, but only abnormal results are displayed) Labs Reviewed  I-STAT CHEM 8, ED - Abnormal; Notable for the following components:      Result Value   Glucose, Bld 107 (*)    Calcium, Ion 1.12 (*)    All other components within normal limits  URINE CULTURE  URINALYSIS, ROUTINE W REFLEX MICROSCOPIC  CBC WITH DIFFERENTIAL/PLATELET  HIV ANTIBODY (ROUTINE TESTING)  SEDIMENTATION RATE    EKG None  Radiology Ct Abdomen Pelvis W Contrast  Result Date: 09/27/2017 CLINICAL DATA:  Testicular pain, left testicular pain for 1 month EXAM: CT ABDOMEN AND PELVIS WITH CONTRAST TECHNIQUE: Multidetector CT imaging of the abdomen and pelvis was performed using the standard protocol following bolus administration of intravenous contrast. CONTRAST:  ISOVUE-300 IOPAMIDOL (ISOVUE-300) INJECTION 61% COMPARISON:  None. FINDINGS: Lower chest: Bibasilar atelectasis. Hepatobiliary: No focal liver abnormality is seen. No gallstones, gallbladder wall thickening, or biliary dilatation. Pancreas:  Unremarkable. No pancreatic ductal dilatation or surrounding inflammatory changes. Spleen: Normal in size without focal abnormality. Adrenals/Urinary Tract: Adrenal glands are unremarkable. Kidneys are normal, without renal calculi, focal lesion, or hydronephrosis. Bladder is unremarkable. Stomach/Bowel: Stomach is within normal limits. Appendix appears normal. No evidence of bowel wall thickening, distention, or inflammatory changes. Diverticulosis of the sigmoid colon without evidence of diverticulitis. Vascular/Lymphatic: Aortic atherosclerosis. No enlarged abdominal or pelvic lymph nodes. Reproductive: Prostate is unremarkable. Other: No abdominal wall hernia or abnormality. No abdominopelvic ascites. Musculoskeletal: No acute or significant osseous findings. 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 IMPRESSION: 1. No acute abdominal or pelvic pathology. Electronically Signed   By: Elige Ko   On: 09/27/2017 17:44   US Scrotum W/doppler  Result Date: 09/27/2017 CLINICAL DATA:  Testicular pain. EXAM: SCROTAL ULTRASOUND DOPPLER ULTRASOUND OF THE TESTICLES TECHNIQUE: Complete ultrasound examination of the testicles, epididymis,  and other scrotal structures was performed. Color and spectral Doppler ultrasound were also utilized to evaluate blood flow to the testicles. COMPARISON:  09/25/2017 FINDINGS: Right testicle Measurements: 4 x 2 x 2.5 cm.  No mass or microlithiasis visualized. Left testicle Measurements: 4.5 x 2 x 3.1 cm. 5.7 x 6.3 x 6.2 mm anechoic left testicular mass most consistent with a small cyst. No solid mass or microlithiasis visualized. Right epididymis:  Normal in size and appearance. Left epididymis:  Normal in size and appearance. Hydrocele:  Trace bilateral hydroceles. Varicocele:  Small left varicocele. Pulsed Doppler interrogation of both testes demonstrates normal low resistance arterial and venous waveforms bilaterally. IMPRESSION: 1. No  testicular torsion. 2. Small left varicocele. Electronically Signed   By: Elige Ko   On: 09/27/2017 19:36    Procedures Procedures (including critical care time)  Medications Ordered in ED Medications  baclofen (LIORESAL) tablet 10 mg (has no administration in time range)  doxycycline (VIBRAMYCIN) capsule 100 mg (has no administration in time range)  gabapentin (NEURONTIN) capsule 300 mg (has no administration in time range)  metaxalone (SKELAXIN) tablet 800 mg (has no administration in time range)  oxyCODONE-acetaminophen (PERCOCET/ROXICET) 5-325 MG per tablet 1-2 tablet (has no administration in time range)  pramipexole (MIRAPEX) tablet 0.25 mg (has no administration in time range)  enoxaparin (LOVENOX) injection 40 mg (has no administration in time range)  acetaminophen (TYLENOL) tablet 650 mg (has no administration in time range)    Or  acetaminophen (TYLENOL) suppository 650 mg (has no administration in time range)  ketorolac (TORADOL) 30 MG/ML injection 30 mg (30 mg Intravenous Given 09/28/17 2023)  senna-docusate (Senokot-S) tablet 1 tablet (has no administration in time range)  bisacodyl (DULCOLAX) EC tablet 5 mg (has no administration in time range)  ondansetron (ZOFRAN) tablet 4 mg (has no administration in time range)    Or  ondansetron (ZOFRAN) injection 4 mg (has no administration in time range)  famotidine (PEPCID) IVPB 20 mg premix (has no administration in time range)  HYDROmorphone (DILAUDID) tablet 4 mg (4 mg Oral Given 09/28/17 2025)  ondansetron (ZOFRAN-ODT) disintegrating tablet 4 mg (4 mg Oral Given 09/28/17 1609)  HYDROmorphone (DILAUDID) injection 1 mg (1 mg Intravenous Given 09/28/17 1702)  ondansetron (ZOFRAN) injection 4 mg (4 mg Intravenous Given 09/28/17 1701)  gabapentin (NEURONTIN) capsule 200 mg (200 mg Oral Given 09/28/17 1722)  ketorolac (TORADOL) 30 MG/ML injection 30 mg (30 mg Intravenous Given 09/28/17 1746)  HYDROmorphone (DILAUDID) injection 1 mg (1 mg  Intravenous Given 09/28/17 1835)  fentaNYL (SUBLIMAZE) injection 50 mcg (50 mcg Intravenous Given 09/28/17 1855)  ketamine (KETALAR) injection 31 mg (31 mg Intravenous Given 09/28/17 1934)     Initial Impression / Assessment and Plan / ED Course  I have reviewed the triage vital signs and the nursing notes.  Pertinent labs & imaging results that were available during my care of the patient were reviewed by me and considered in my medical decision making (see chart for details).     BP 137/88   Pulse 70   Temp 98 F (36.7 C) (Oral)   Resp 19   Ht 5\' 10"  (1.778 m)   Wt 102.1 kg (225 lb)   SpO2 (!) 86%   BMI 32.28 kg/m    Final Clinical Impressions(s) / ED Diagnoses   Final diagnoses:  Left testicular pain    ED Discharge Orders    None     4:47 PM Patient with recurrent left testicular pain.  Pain is  localized to his left epididymal region.  It has been ongoing for nearly a month but pain has become much more excruciating and not tolerable at home.  This is her third ER visits within the past week for the same as well as a visit with urologist several days prior.  He has had 2 scrotal ultrasound without acute finding, no evidence of epididymitis or testicular torsion on ultrasound.  He has had an abdominal and pelvis CT scan without acute finding.  He is still very uncomfortable at this time.  Given his recurrent discomfort, will discuss with urologist for recommendation.  Pain medication given.  Care discussed with Dr. Criss Alvine.  5:03 PM Appreciate consultation from oncall urologist Dr. Mena Goes who felt pt is likely having pelvic floor pain, relating to nerve and muscle irritation and not the testicle itself. He does not think additional imaging will be necessary.  He does recommend antiinflammatory and muscle relaxant may be helpful. Recommend Toradol, Neurontin, flexeril for symptom relief.    7:30 PM Persistent pain despite receiving multiple doses of dilaudid, Fentanyl,  toradol, neurontin.  After discussing with Dr. Criss Alvine who also evaluated pt, we'll provide analgesic ketamine for symptom control.  Will consult for admission for pain control.   7:42 PM Appreciate consultation with [facility] Dr. Antionette Char who agrees to see and admit pt for pain control.   Fayrene Helper, PA-C 09/28/17 2104    Pricilla Loveless, MD 09/29/17 [phone]

## 2017-09-28 NOTE — H&P (Signed)
History and Physical    Lance Carter ZSW:[id number] DOB: Jan 09, 1986 DOA: 09/28/2017  PCP: Gennaro Africa, MD   Patient coming from: Home  Chief Complaint: Severe left scrotal pain   HPI: Lance Carter is a 32 y.o. male with medical history significant for chronic low back pain, now presenting to the emergency department with severe pain in the left scrotum.  Patient developed severe pain in the left scrotum approximately 1 month ago and has had waxing and waning of his symptoms since then.  He has been seen in the emergency department multiple times for this complaint and has had unremarkable blood work, negative ultrasound, normal urinalysis, and unremarkable CT of the abdomen and pelvis.  He has seen urology in the clinic for this and referred pain was suspected.  There has not been any fevers, chills, discharge, rash, or objective findings.  On his most recent ED visit, again with normal blood work and negative imaging, he was started on doxycycline for possible epididymitis.  Pain is continued to wax and wane and was severe tonight, prompting his presentation to the ED again.  ED Course: Upon arrival to the ED, patient is found to be afebrile, saturating well on room air, and with vitals otherwise normal.  Chemistry panel and CBC are unremarkable.  Patient was treated with fentanyl, Toradol, Dilaudid, Neurontin, and ketamine in the emergency department.  ED physician discussed the case with urology who advised that repeat imaging would be of little value, indicating this is likely referred pain from the low back or pelvic floor.  Patient continues to be in intractable pain despite aggressive management in the ED, and he will be observed on the medical-surgical unit for ongoing evaluation and management of this.  Review of Systems:  All other systems reviewed and apart from HPI, are negative.  Past Medical History:  Diagnosis Date  . Chronic lower back pain 2015   with left lumbar  radiculopathy  . History of chicken pox   . S/P discectomy for herniated nucleus pulposus 04/2014    Past Surgical History:  Procedure Laterality Date  . INGUINAL HERNIA REPAIR     as child  . 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 He has never used smokeless tobacco. He reports that he drinks alcohol. He reports that he does not use drugs.  Allergies  Allergen Reactions  . Sulfa Antibiotics Hives    Family History  Problem Relation Age of Onset  . Alcohol abuse Maternal Grandmother        cirrhosis, rest of maternal side  . Diabetes Paternal Grandfather      Prior to Admission medications   Medication Sig Start Date End Date Taking? Authorizing Provider  baclofen (LIORESAL) 10 MG tablet Take 10 mg by mouth daily as needed for muscle spasms.    [provider]  diclofenac sodium (VOLTAREN) 1 % GEL Apply 1 application topically daily as needed. For pain 12/20/16 12/20/17  [provider]  doxycycline (VIBRAMYCIN) 100 MG capsule Take 1 capsule (100 mg total) by mouth 2 (two) times daily. 09/27/17   Hedges, Dellis Filbert, PA-C  gabapentin (NEURONTIN) 300 MG capsule Take 300 mg by mouth 2 (two) times daily. May take 4 times daily as prescribed*    [provider]  ibuprofen (ADVIL,MOTRIN) 200 MG tablet Take 400-600 mg by mouth every 6 (six) hours as needed for mild pain or moderate pain.  [provider]  metaxalone (SKELAXIN) 800 MG tablet Take 800 mg by mouth 3 (three) times daily as needed. 08/15/17   [provider]  oxyCODONE-acetaminophen (PERCOCET/ROXICET) 5-325 MG tablet Take 1 tablet by mouth 5 (five) times daily.  05/12/15   [provider]  pramipexole (MIRAPEX) 0.25 MG tablet Take 0.25 mg by mouth at bedtime.     [provider]    Physical Exam: Vitals:   09/28/17 1553 09/28/17 1554 09/28/17 1900  BP: (!) 159/92  (!) 155/103  Pulse: 85  81  Resp: 16  16    Temp: 98 F (36.7 C)    TempSrc: Oral    SpO2: 98%  95%  Weight:  102.1 kg (225 lb)   Height:  5' 10"  (1.778 m)       Constitutional: NAD, calm, in obvious discomfort Eyes: PERTLA, lids and conjunctivae normal ENMT: Mucous membranes are moist. Posterior pharynx clear of any exudate or lesions.   Neck: normal, supple, no masses, no thyromegaly Respiratory: clear to auscultation bilaterally, no wheezing, no crackles. Normal respiratory effort.   Cardiovascular: S1 & S2 heard, regular rate and rhythm. No significant JVD. Abdomen: No distension, no tenderness, soft. Bowel sounds normal.  Musculoskeletal: no clubbing / cyanosis. No joint deformity upper and lower extremities.   Skin: no significant rashes, lesions, ulcers. Warm, dry, well-perfused. Neurologic: CN 2-12 grossly intact. Sensation intact. Strength 5/5 in all 4 limbs.  Psychiatric: Alert and oriented x 3. Calm, cooperative.     Labs on Admission: I have personally reviewed following labs and imaging studies  CBC: Recent Labs  Lab 09/27/17 1619 09/28/17 1703 09/28/17 1714  WBC 7.2 8.8  --   NEUTROABS 4.4 6.7  --   HGB 15.5 16.1 15.6  HCT 43.5 45.1 46.0  MCV 85.1 85.1  --   PLT 294 288  --    Basic Metabolic Panel: Recent Labs  Lab 09/27/17 1619 09/28/17 1714  NA 138 140  K 3.6 3.7  CL 102 104  CO2 22  --   GLUCOSE 130* 107*  BUN 10 11  CREATININE 1.04 1.10  CALCIUM 9.7  --    GFR: Estimated Creatinine Clearance: 116.4 mL/min (by C-G formula based on SCr of 1.1 mg/dL). Liver Function Tests: Recent Labs  Lab 09/27/17 1619  AST 28  ALT 42  ALKPHOS 57  BILITOT 0.8  PROT 6.8  ALBUMIN 4.6   No results for input(s): LIPASE, AMYLASE in the last 168 hours. No results for input(s): AMMONIA in the last 168 hours. Coagulation Profile: No results for input(s): INR, PROTIME in the last 168 hours. Cardiac Enzymes: No results for input(s): CKTOTAL, CKMB, CKMBINDEX, TROPONINI in the last 168 hours. BNP  (last 3 results) No results for input(s): PROBNP in the last 8760 hours. HbA1C: No results for input(s): HGBA1C in the last 72 hours. CBG: No results for input(s): GLUCAP in the last 168 hours. Lipid Profile: No results for input(s): CHOL, HDL, LDLCALC, TRIG, CHOLHDL, LDLDIRECT in the last 72 hours. Thyroid Function Tests: No results for input(s): TSH, T4TOTAL, FREET4, T3FREE, THYROIDAB in the last 72 hours. Anemia Panel: No results for input(s): VITAMINB12, FOLATE, FERRITIN, TIBC, IRON, RETICCTPCT in the last 72 hours. Urine analysis:    Component Value Date/Time   COLORURINE YELLOW 09/28/2017 1801   APPEARANCEUR CLEAR 09/28/2017 1801   LABSPEC 1.013 09/28/2017 1801   PHURINE 8.0 09/28/2017 1801   GLUCOSEU NEGATIVE 09/28/2017 1801   HGBUR NEGATIVE 09/28/2017 1801   BILIRUBINUR  NEGATIVE 09/28/2017 1801   KETONESUR NEGATIVE 09/28/2017 1801   PROTEINUR NEGATIVE 09/28/2017 1801   NITRITE NEGATIVE 09/28/2017 1801   LEUKOCYTESUR NEGATIVE 09/28/2017 1801   Sepsis Labs: @LABRCNTIP (procalcitonin:4,lacticidven:4) )No results found for this or any previous visit (from the past 240 hour(s)).   Radiological Exams on Admission: Ct Abdomen Pelvis W Contrast  Result Date: 09/27/2017 CLINICAL DATA:  Testicular pain, left testicular pain for 1 month EXAM: CT ABDOMEN AND PELVIS WITH CONTRAST TECHNIQUE: Multidetector CT imaging of the abdomen and pelvis was performed using the standard protocol following bolus administration of intravenous contrast. CONTRAST:  112m ISOVUE-300 IOPAMIDOL (ISOVUE-300) INJECTION 61% COMPARISON:  None. FINDINGS: Lower chest: Bibasilar atelectasis. Hepatobiliary: No focal liver abnormality is seen. No gallstones, gallbladder wall thickening, or biliary dilatation. Pancreas: Unremarkable. No pancreatic ductal dilatation or surrounding inflammatory changes. Spleen: Normal in size without focal abnormality. Adrenals/Urinary Tract: Adrenal glands are unremarkable. Kidneys are  normal, without renal calculi, focal lesion, or hydronephrosis. Bladder is unremarkable. Stomach/Bowel: Stomach is within normal limits. Appendix appears normal. No evidence of bowel wall thickening, distention, or inflammatory changes. Diverticulosis of the sigmoid colon without evidence of diverticulitis. Vascular/Lymphatic: Aortic atherosclerosis. No enlarged abdominal or pelvic lymph nodes. Reproductive: Prostate is unremarkable. Other: No abdominal wall hernia or abnormality. No abdominopelvic ascites. Musculoskeletal: No acute or significant osseous findings. 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 IMPRESSION: 1. No acute abdominal or pelvic pathology. Electronically Signed   By: HKathreen Devoid  On: 09/27/2017 17:44   [place]rot[place] W/doppler  Result Date: 09/27/2017 CLINICAL DATA:  Testicular pain. EXAM: SCROTAL ULTRASOUND DOPPLER ULTRASOUND OF THE TESTICLES TECHNIQUE: Complete ultrasound examination of the testicles, epididymis, and other scrotal structures was performed. Color and spectral Doppler ultrasound were also utilized to evaluate blood flow to the testicles. COMPARISON:  09/25/2017 FINDINGS: Right testicle Measurements: 4 x 2 x 2.5 cm.  No mass or microlithiasis visualized. Left testicle Measurements: 4.5 x 2 x 3.1 cm. 5.7 x 6.3 x 6.2 mm anechoic left testicular mass most consistent with a small cyst. No solid mass or microlithiasis visualized. Right epididymis:  Normal in size and appearance. Left epididymis:  Normal in size and appearance. Hydrocele:  Trace bilateral hydroceles. Varicocele:  Small left varicocele. Pulsed Doppler interrogation of both testes demonstrates normal low resistance arterial and venous waveforms bilaterally. IMPRESSION: 1. No testicular torsion. 2. Small left varicocele. Electronically Signed   By: HKathreen Devoid  On: 09/27/2017 19:36    EKG: Not performed.   Assessment/Plan   1. Intractable scrotal pain  - Presents with  severe pain in left anterior scrotum that has waxed and waned over the past month  - He has been evaluated in ED several times, and in urology clinic for this with negative [place] negative CT abd/pelvis, normal blood work, negative GC & chlamydia; no objective exam findings  - He is being treated empirically for possible epididymitis with doxycycline   - He was treated in ED with fentanyl, Dilaudid, Toradol, Neurontin, and ketamine, but continues to be in tears and reporting intolerable pain  - Likely referred pain per urology, given the extensive workup that has been negative   - Continue analgesia, check ESR for possible vasculitis    2. Chronic pain  - Suffers from chronic low back pain, managed at home with muscle relaxant and Percocet    DVT prophylaxis: Lovenox Code Status: Full  Family Communication: Significant other updated at bedside with patient's permission  Consults called: ED  physician discussed with urology  Admission status: Observation    Vianne Bulls, MD [facility] Pager [phone]  If 7PM-7AM, please contact night-coverage [email or web address] Password [facility]  09/28/2017, 8:04 PM

## 2017-09-28 NOTE — Progress Notes (Signed)
Called to bedside at request of patient and his family regarding uncontrolled pain.   He remains afebrile with normal vitals. On exam, he is tearful and grimacing, but no other objective findings.   Reports that only fentanyl helped take the edge off his pain, and it only helped briefly.   Patient and his family are frustrated that pain has not been controlled.   He received fentanyl, Dilaudid, Toradol, Neurontin and ketamine in ED. He has received Toradol, Dilaudid, Percocet, and morphine since admission.    Will plan to start PCA with fentanyl.

## 2017-09-28 NOTE — ED Notes (Signed)
Sent label to main lab to run esr blood test.

## 2017-09-28 NOTE — ED Triage Notes (Signed)
Pt endorses left testicular pain x 1 month intermittently, and constant pain x 5 days. [facility] and here yesterday. Being treated for epididymitis, was told that CT and US were negative except for a hydrocele. Pt appears very uncomfortable.

## 2017-09-29 ENCOUNTER — Other Ambulatory Visit: Payer: Self-pay

## 2017-09-29 DIAGNOSIS — R52 Pain, unspecified: Secondary | ICD-10-CM | POA: Diagnosis not present

## 2017-09-29 LAB — RAPID URINE DRUG SCREEN, HOSP PERFORMED
Amphetamines: NOT DETECTED
BENZODIAZEPINES: NOT DETECTED
Barbiturates: NOT DETECTED
COCAINE: NOT DETECTED
OPIATES: POSITIVE — AB
Tetrahydrocannabinol: NOT DETECTED

## 2017-09-29 MED ORDER — PREDNISONE 10 MG (21) PO TBPK
10.0000 mg | ORAL_TABLET | ORAL | Status: AC
  Administered 2017-09-29: 10 mg via ORAL

## 2017-09-29 MED ORDER — PREDNISONE 10 MG (21) PO TBPK
20.0000 mg | ORAL_TABLET | Freq: Every evening | ORAL | Status: AC
  Administered 2017-09-29: 20 mg via ORAL

## 2017-09-29 MED ORDER — PREGABALIN 75 MG PO CAPS
75.0000 mg | ORAL_CAPSULE | Freq: Three times a day (TID) | ORAL | Status: DC
  Administered 2017-09-29 – 2017-10-04 (×16): 75 mg via ORAL
  Filled 2017-09-29 (×16): qty 1

## 2017-09-29 MED ORDER — PREDNISONE 10 MG (21) PO TBPK
10.0000 mg | ORAL_TABLET | Freq: Three times a day (TID) | ORAL | Status: DC
  Administered 2017-09-30 (×2): 10 mg via ORAL

## 2017-09-29 MED ORDER — DIPHENHYDRAMINE HCL 50 MG/ML IJ SOLN: 12.5000 mg | Freq: Four times a day (QID) | INTRAMUSCULAR | Status: DC | PRN

## 2017-09-29 MED ORDER — DICLOFENAC SODIUM 1 % TD GEL
4.0000 g | Freq: Four times a day (QID) | TRANSDERMAL | Status: DC
  Administered 2017-09-29 – 2017-09-30 (×2): 4 g via TOPICAL
  Filled 2017-09-29: qty 100

## 2017-09-29 MED ORDER — HYDROMORPHONE HCL 1 MG/ML IJ SOLN
2.0000 mg | INTRAMUSCULAR | Status: DC | PRN
  Administered 2017-09-29: 2 mg via INTRAVENOUS
  Filled 2017-09-29: qty 2

## 2017-09-29 MED ORDER — HYDROMORPHONE HCL 1 MG/ML IJ SOLN
2.0000 mg | Freq: Once | INTRAMUSCULAR | Status: AC
  Administered 2017-09-29: 2 mg via INTRAVENOUS
  Filled 2017-09-29: qty 2

## 2017-09-29 MED ORDER — DIPHENHYDRAMINE HCL 12.5 MG/5ML PO ELIX: 12.5000 mg | ORAL_SOLUTION | Freq: Four times a day (QID) | ORAL | Status: DC | PRN

## 2017-09-29 MED ORDER — HYDROMORPHONE HCL 1 MG/ML IJ SOLN: 2.0000 mg | Freq: Once | INTRAMUSCULAR | Status: DC

## 2017-09-29 MED ORDER — HYDROMORPHONE 1 MG/ML IV SOLN
INTRAVENOUS | Status: DC
  Administered 2017-09-29: 1 mg via INTRAVENOUS
  Administered 2017-09-30: 1.5 mg via INTRAVENOUS
  Administered 2017-09-30: 2.7 mg via INTRAVENOUS
  Filled 2017-09-29: qty 25

## 2017-09-29 MED ORDER — PREDNISONE 10 MG (21) PO TBPK
20.0000 mg | ORAL_TABLET | Freq: Every morning | ORAL | Status: AC
  Administered 2017-09-29: 20 mg via ORAL
  Filled 2017-09-29 (×2): qty 21

## 2017-09-29 MED ORDER — PREDNISONE 10 MG (21) PO TBPK
10.0000 mg | ORAL_TABLET | ORAL | Status: AC
  Administered 2017-09-29: 10 mg via ORAL
  Filled 2017-09-29: qty 21

## 2017-09-29 MED ORDER — NALOXONE HCL 0.4 MG/ML IJ SOLN: 0.4000 mg | INTRAMUSCULAR | Status: DC | PRN

## 2017-09-29 MED ORDER — PREDNISONE 10 MG (21) PO TBPK: 20.0000 mg | ORAL_TABLET | Freq: Every evening | ORAL | Status: DC

## 2017-09-29 MED ORDER — FAMOTIDINE 20 MG PO TABS
20.0000 mg | ORAL_TABLET | Freq: Two times a day (BID) | ORAL | Status: DC
  Administered 2017-09-29 – 2017-10-07 (×17): 20 mg via ORAL
  Filled 2017-09-29 (×17): qty 1

## 2017-09-29 MED ORDER — GABAPENTIN 400 MG PO CAPS
800.0000 mg | ORAL_CAPSULE | Freq: Three times a day (TID) | ORAL | Status: DC
  Administered 2017-09-29: 800 mg via ORAL
  Filled 2017-09-29: qty 2

## 2017-09-29 MED ORDER — PREDNISONE 10 MG (21) PO TBPK: 10.0000 mg | ORAL_TABLET | Freq: Four times a day (QID) | ORAL | Status: DC

## 2017-09-29 MED ORDER — SODIUM CHLORIDE 0.9% FLUSH: 9.0000 mL | INTRAVENOUS | Status: DC | PRN

## 2017-09-29 NOTE — Progress Notes (Signed)
[facility]                                                                              Patient Demographics  Lance Carter, is a 32 y.o. male, DOB - 07-Jun-1986, ZOX:[id number]  Admit date - 09/28/2017   Admitting Physician Briscoe Deutscher, MD  Outpatient Primary MD for the patient is Daaleman, Nadyne Coombes, MD  Outpatient specialists:   LOS - 0  days    Chief Complaint  Patient presents with  . Testicle Pain       Brief summary  Lance Carter is a 32 y.o. male with medical history significant for chronic low back pain, presenting to the ED multiple times for progressively worsening left scrotal pain for about a month which has been waxing and waning.  His urinalysis , blood work as well as testicular ultrasound and CT abdomen and pelvis have been negative.  No history of fever or chills or penile discharge or rash and has not had any objective genitourinary findings and exam.  He has been seen by urology and his pain is felt to be non-urologic in etiology.  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   He suffers from chronic low back pain.   The dermatomal area of involvement in his symptoms falls into the genitofemoral nerve territory.    Considering the fact that the genitofemoral nerve is from L1/L2 segments of the lumbar plexus of nerves , it is prudent to exclude degenerative disc disease with radiculopathy/radiculitis involving the L1/L2 nerve roots with referred pain to the anterior scrotal sac through the genitofemoral nerve. Therefore MRI lumbar spine without contrast will be  ordered to exclude this consideration. Urology do not feel they have much to offer. We will contine pain management with addition of steroid with up-titration of gabapentin, and supportive care for now.   Code Status: Full code DVT Prophylaxis:  Lovenox  Family Communication: Discussed in detail with the patient, all imaging results, lab results explained to the patient and mother   Disposition Plan: Home  Time Spent in minutes   35 minutes  Procedures:    Consultants:   Urology consulted by ED physician  Antimicrobials:      Medications  Scheduled Meds: . doxycycline  100 mg Oral BID  . enoxaparin (LOVENOX) injection  40 mg Subcutaneous Q24H  . famotidine  20 mg Oral BID  . fentaNYL   Intravenous Q4H  . gabapentin  300 mg Oral TID  . ketorolac  30 mg Intravenous Q6H  . pramipexole  0.25 mg Oral QHS   Continuous Infusions: PRN Meds:.acetaminophen **OR** acetaminophen, baclofen, bisacodyl, diphenhydrAMINE **OR** diphenhydrAMINE, metaxalone, naloxone **AND** sodium chloride flush, ondansetron **OR** ondansetron (ZOFRAN) IV, ondansetron (ZOFRAN) IV, senna-docusate   Antibiotics   Anti-infectives (  From admission, onward)   Start     Dose/Rate Route Frequency Ordered Stop   09/28/17 2230  doxycycline (VIBRA-TABS) tablet 100 mg     100 mg Oral 2 times daily 09/28/17 1957          Subjective:   Jadyn Barge was seen and examined today.  Patient with continued pain  Objective:   Vitals:   09/29/17 0400 09/29/17 0443 09/29/17 0840 09/29/17 0953  BP: (!) 95/58   112/73  Pulse: (!) 49   66  Resp:  18 12 15   Temp: 98.2 F (36.8 C)   98.2 F (36.8 C)  TempSrc: Oral   Oral  SpO2: 96% 99% 96% 93%  Weight:      Height:        Intake/Output Summary (Last 24 hours) at 09/29/2017 1009 Last data filed at 09/29/2017 8295 Gross per 24 hour  Intake 270 ml  Output -  Net 270 ml     Wt Readings from Last 3 Encounters:  09/28/17 102.1 kg (225 lb)  09/24/17 102.1  kg (225 lb)  11/27/16 99.8 kg (220 lb)     Exam  General: NAD  HEENT: NCAT,  PERRL,MMM  Neck: SUPPLE, (-) JVD  Cardiovascular: RRR, (-) GALLOP, (-) MURMUR  Respiratory: CTA  Gastrointestinal: SOFT, (-) DISTENSION, BS(+), (_) TENDERNESS  Ext: (-) CYANOSIS, (-) EDEMA  Detailed genitourinary exam was unremarkable except for exquisite hypalgesia of the anterior scrotal sac without specific tenderness of the testicles or epididymis  Neuro: A, OX 3  Skin:(-) RASH  Psych:NORMAL AFFECT/MOOD   Data Reviewed:  I have personally reviewed following labs and imaging studies  Micro Results No results found for this or any previous visit (from the past 240 hour(s)).  Radiology Reports Ct Abdomen Pelvis W Contrast  Result Date: 09/27/2017 CLINICAL DATA:  Testicular pain, left testicular pain for 1 month EXAM: CT ABDOMEN AND PELVIS WITH CONTRAST TECHNIQUE: Multidetector CT imaging of the abdomen and pelvis was performed using the standard protocol following bolus administration of intravenous contrast. CONTRAST:  ISOVUE-300 IOPAMIDOL (ISOVUE-300) INJECTION 61% COMPARISON:  None. FINDINGS: Lower chest: Bibasilar atelectasis. Hepatobiliary: No focal liver abnormality is seen. No gallstones, gallbladder wall thickening, or biliary dilatation. Pancreas: Unremarkable. No pancreatic ductal dilatation or surrounding inflammatory changes. Spleen: Normal in size without focal abnormality. Adrenals/Urinary Tract: Adrenal glands are unremarkable. Kidneys are normal, without renal calculi, focal lesion, or hydronephrosis. Bladder is unremarkable. Stomach/Bowel: Stomach is within normal limits. Appendix appears normal. No evidence of bowel wall thickening, distention, or inflammatory changes. Diverticulosis of the sigmoid colon without evidence of diverticulitis. Vascular/Lymphatic: Aortic atherosclerosis. No enlarged abdominal or pelvic lymph nodes. Reproductive: Prostate is unremarkable. Other: No  abdominal wall hernia or abnormality. No abdominopelvic ascites. Musculoskeletal: No acute or significant osseous findings. 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 IMPRESSION: 1. No acute abdominal or pelvic pathology. Electronically Signed   By: Elige Ko   On: 09/27/2017 17:44   US Scrotum W/doppler  Result Date: 09/27/2017 CLINICAL DATA:  Testicular pain. EXAM: SCROTAL ULTRASOUND DOPPLER ULTRASOUND OF THE TESTICLES TECHNIQUE: Complete ultrasound examination of the testicles, epididymis, and other scrotal structures was performed. Color and spectral Doppler ultrasound were also utilized to evaluate blood flow to the testicles. COMPARISON:  09/25/2017 FINDINGS: Right testicle Measurements: 4 x 2 x 2.5 cm.  No mass or microlithiasis visualized. Left testicle Measurements: 4.5 x 2 x 3.1 cm. 5.7 x 6.3 x 6.2 mm anechoic left testicular mass  most consistent with a small cyst. No solid mass or microlithiasis visualized. Right epididymis:  Normal in size and appearance. Left epididymis:  Normal in size and appearance. Hydrocele:  Trace bilateral hydroceles. Varicocele:  Small left varicocele. Pulsed Doppler interrogation of both testes demonstrates normal low resistance arterial and venous waveforms bilaterally. IMPRESSION: 1. No testicular torsion. 2. Small left varicocele. Electronically Signed   By: Elige KoHetal  Patel   On: 09/27/2017 19:36   [place] Scrotum W/doppler  Result Date: 09/25/2017 CLINICAL DATA:  Left testicle pain EXAM: SCROTAL ULTRASOUND DOPPLER ULTRASOUND OF THE TESTICLES TECHNIQUE: Complete ultrasound examination of the testicles, epididymis, and other scrotal structures was performed. Color and spectral Doppler ultrasound were also utilized to evaluate blood flow to the testicles. COMPARISON:  None. FINDINGS: Right testicle Measurements: 4.1 x 2.1 x 3 cm. No mass or microlithiasis visualized. Left testicle Measurements: 4.9 x 2.3 x 2.8 cm. Intratesticular  cyst measuring 0.7 x 0.7 x 0.6 cm. Right epididymis:  Normal in size and appearance. Left epididymis:  Normal in size and appearance. Hydrocele:  Small bilateral hydroceles. Varicocele:  Small left varicocele. Pulsed Doppler interrogation of both testes demonstrates normal low resistance arterial and venous waveforms bilaterally. IMPRESSION: 1. Negative for testicular torsion 2. Small bilateral hydroceles.  Small left varicocele 3. 7 mm left intratesticular cyst Electronically Signed   By: Jasmine PangKim  Fujinaga M.D.   On: 09/25/2017 01:05    Lab Data:  CBC: Recent Labs  Lab 09/27/17 1619 09/28/17 1703 09/28/17 1714  WBC 7.2 8.8  --   NEUTROABS 4.4 6.7  --   HGB 15.5 16.1 15.6  HCT 43.5 45.1 46.0  MCV 85.1 85.1  --   PLT 294 288  --    Basic Metabolic Panel: Recent Labs  Lab 09/27/17 1619 09/28/17 1714  NA 138 140  K 3.6 3.7  CL 102 104  CO2 22  --   GLUCOSE 130* 107*  BUN 10 11  CREATININE 1.04 1.10  CALCIUM 9.7  --    GFR: Estimated Creatinine Clearance: 116.4 mL/min (by C-G formula based on SCr of 1.1 mg/dL). Liver Function Tests: Recent Labs  Lab 09/27/17 1619  AST 28  ALT 42  ALKPHOS 57  BILITOT 0.8  PROT 6.8  ALBUMIN 4.6   No results for input(s): LIPASE, AMYLASE in the last 168 hours. No results for input(s): AMMONIA in the last 168 hours. Coagulation Profile: No results for input(s): INR, PROTIME in the last 168 hours. Cardiac Enzymes: No results for input(s): CKTOTAL, CKMB, CKMBINDEX, TROPONINI in the last 168 hours. BNP (last 3 results) No results for input(s): PROBNP in the last 8760 hours. HbA1C: No results for input(s): HGBA1C in the last 72 hours. CBG: No results for input(s): GLUCAP in the last 168 hours. Lipid Profile: No results for input(s): CHOL, HDL, LDLCALC, TRIG, CHOLHDL, LDLDIRECT in the last 72 hours. Thyroid Function Tests: No results for input(s): TSH, T4TOTAL, FREET4, T3FREE, THYROIDAB in the last 72 hours. Anemia Panel: No results for  input(s): VITAMINB12, FOLATE, FERRITIN, TIBC, IRON, RETICCTPCT in the last 72 hours. Urine analysis:    Component Value Date/Time   COLORURINE YELLOW 09/28/2017 1801   APPEARANCEUR CLEAR 09/28/2017 1801   LABSPEC 1.013 09/28/2017 1801   PHURINE 8.0 09/28/2017 1801   GLUCOSEU NEGATIVE 09/28/2017 1801   HGBUR NEGATIVE 09/28/2017 1801   BILIRUBINUR NEGATIVE 09/28/2017 1801   KETONESUR NEGATIVE 09/28/2017 1801   PROTEINUR NEGATIVE 09/28/2017 1801   NITRITE NEGATIVE 09/28/2017 1801   LEUKOCYTESUR NEGATIVE 09/28/2017 1801  Jackie Plum M.D. [facility] 09/29/2017, 10:09 AM  Pager: [phone] Between 7am to 7pm - call Pager - [phone]  After 7pm go to [email or web address] - password [email or web address]  Call night coverage person covering after 7pm

## 2017-09-29 NOTE — Progress Notes (Signed)
Patient c/o pain 9/10 tearful and grimacing, Dr. Antionette Charpyd notified with new order.

## 2017-09-29 NOTE — Progress Notes (Signed)
Patient received from ED to room 6N05. Alert and oriented x4. Pain level 3/10 but feels like it is coming back. Oriented to room. Family at bedside.  Will check admission orders for pain.

## 2017-09-29 NOTE — Progress Notes (Signed)
Patient claimed his pain is getting worst wants MD to increase the Fentanyl PCA dose. Dr. Julio Sickssei-Bonsu was made aware. Called again to follow up

## 2017-09-29 NOTE — Progress Notes (Signed)
Dr. Cloyd Stagerssei Bonsu called back and talked to the patient"s mother.

## 2017-09-29 NOTE — Progress Notes (Signed)
Patient received new order for Dilaudid iv 2 mg q 3 hrs PRN for pain earlier than scheduled.  Explained to patient and family that this will happen only once and starting now the Dilaudid will be as ordered q 3 hrs as need for pain.  Patient and family in agreement.  Patient and family are requesting on-call MD to visit patient tonight.  They would like on-call to actually see how much pain patient is in and they have more questions for MD.  I will Amion message to on-call.

## 2017-09-29 NOTE — Progress Notes (Signed)
Patient is rolling around in bed in intense pain.  Day shift nurse gave one time dose of Dilaudid iv. [facility]  Contacting on-call to request Dilaudid iv and to dc pca Fentanyl as per patient and family request.  Patient and family also are requesting Lyrica instead of order Neurontin.  Patient and family are requesting Voltaren gel 1% and K-pad.  Waiting for on-call response.

## 2017-09-30 ENCOUNTER — Observation Stay (HOSPITAL_COMMUNITY)

## 2017-09-30 DIAGNOSIS — N50812 Left testicular pain: Secondary | ICD-10-CM | POA: Diagnosis not present

## 2017-09-30 DIAGNOSIS — Z79891 Long term (current) use of opiate analgesic: Secondary | ICD-10-CM | POA: Diagnosis not present

## 2017-09-30 DIAGNOSIS — F419 Anxiety disorder, unspecified: Secondary | ICD-10-CM | POA: Diagnosis present

## 2017-09-30 DIAGNOSIS — Z79899 Other long term (current) drug therapy: Secondary | ICD-10-CM | POA: Diagnosis not present

## 2017-09-30 DIAGNOSIS — T402X5A Adverse effect of other opioids, initial encounter: Secondary | ICD-10-CM | POA: Diagnosis not present

## 2017-09-30 DIAGNOSIS — M5440 Lumbago with sciatica, unspecified side: Secondary | ICD-10-CM | POA: Diagnosis not present

## 2017-09-30 DIAGNOSIS — R52 Pain, unspecified: Secondary | ICD-10-CM | POA: Diagnosis not present

## 2017-09-30 DIAGNOSIS — N442 Benign cyst of testis: Secondary | ICD-10-CM | POA: Diagnosis present

## 2017-09-30 DIAGNOSIS — F329 Major depressive disorder, single episode, unspecified: Secondary | ICD-10-CM | POA: Diagnosis present

## 2017-09-30 DIAGNOSIS — R208 Other disturbances of skin sensation: Secondary | ICD-10-CM | POA: Diagnosis not present

## 2017-09-30 DIAGNOSIS — I10 Essential (primary) hypertension: Secondary | ICD-10-CM | POA: Diagnosis present

## 2017-09-30 DIAGNOSIS — M545 Low back pain: Secondary | ICD-10-CM | POA: Diagnosis not present

## 2017-09-30 DIAGNOSIS — N50819 Testicular pain, unspecified: Secondary | ICD-10-CM | POA: Diagnosis not present

## 2017-09-30 DIAGNOSIS — G8929 Other chronic pain: Secondary | ICD-10-CM | POA: Diagnosis not present

## 2017-09-30 DIAGNOSIS — N433 Hydrocele, unspecified: Secondary | ICD-10-CM | POA: Diagnosis present

## 2017-09-30 DIAGNOSIS — I861 Scrotal varices: Secondary | ICD-10-CM | POA: Diagnosis present

## 2017-09-30 LAB — URINE CULTURE: Culture: NO GROWTH

## 2017-09-30 LAB — HIV ANTIBODY (ROUTINE TESTING W REFLEX): HIV Screen 4th Generation wRfx: NONREACTIVE

## 2017-09-30 MED ORDER — HYDROMORPHONE HCL 1 MG/ML IJ SOLN
2.0000 mg | INTRAMUSCULAR | Status: DC | PRN
  Administered 2017-09-30 – 2017-10-01 (×4): 2 mg via INTRAVENOUS
  Filled 2017-09-30 (×4): qty 2

## 2017-09-30 MED ORDER — FLUOXETINE HCL 20 MG PO CAPS
20.0000 mg | ORAL_CAPSULE | Freq: Every day | ORAL | Status: DC
  Administered 2017-09-30 – 2017-10-07 (×8): 20 mg via ORAL
  Filled 2017-09-30 (×8): qty 1

## 2017-09-30 MED ORDER — BUPIVACAINE HCL (PF) 0.25 % IJ SOLN
5.0000 mL | Freq: Once | INTRAMUSCULAR | Status: AC
  Administered 2017-09-30: 5 mL
  Filled 2017-09-30: qty 10

## 2017-09-30 MED ORDER — NALOXONE HCL 0.4 MG/ML IJ SOLN: 0.4000 mg | INTRAMUSCULAR | Status: DC | PRN

## 2017-09-30 MED ORDER — LIDOCAINE HCL (PF) 1 % IJ SOLN
5.0000 mL | Freq: Once | INTRAMUSCULAR | Status: AC
  Administered 2017-09-30: 5 mL
  Filled 2017-09-30: qty 30

## 2017-09-30 MED ORDER — KETOROLAC TROMETHAMINE 30 MG/ML IJ SOLN
30.0000 mg | Freq: Once | INTRAMUSCULAR | Status: AC
  Administered 2017-09-30: 30 mg via INTRAVENOUS

## 2017-09-30 MED ORDER — BUPIVACAINE HCL 0.25 % IJ SOLN
5.0000 mL | Freq: Once | INTRAMUSCULAR | Status: DC
  Filled 2017-09-30: qty 5

## 2017-09-30 MED ORDER — DIPHENHYDRAMINE HCL 12.5 MG/5ML PO ELIX: 12.5000 mg | ORAL_SOLUTION | Freq: Four times a day (QID) | ORAL | Status: DC | PRN

## 2017-09-30 MED ORDER — SODIUM CHLORIDE 0.9% FLUSH: 9.0000 mL | INTRAVENOUS | Status: DC | PRN

## 2017-09-30 MED ORDER — DIPHENHYDRAMINE HCL 50 MG/ML IJ SOLN: 12.5000 mg | Freq: Four times a day (QID) | INTRAMUSCULAR | Status: DC | PRN

## 2017-09-30 MED ORDER — LIDOCAINE HCL 1 % IJ SOLN
5.0000 mL | Freq: Once | INTRAMUSCULAR | Status: DC
  Filled 2017-09-30: qty 5

## 2017-09-30 MED ORDER — HYDROMORPHONE HCL 1 MG/ML IJ SOLN
2.0000 mg | Freq: Once | INTRAMUSCULAR | Status: AC
  Administered 2017-09-30: 2 mg via INTRAVENOUS
  Filled 2017-09-30: qty 2

## 2017-09-30 MED ORDER — HYDROMORPHONE HCL 1 MG/ML IJ SOLN
2.0000 mg | INTRAMUSCULAR | Status: DC | PRN
Start: 2017-09-30 — End: 2017-09-30

## 2017-09-30 MED ORDER — ONDANSETRON HCL 4 MG/2ML IJ SOLN
4.0000 mg | Freq: Four times a day (QID) | INTRAMUSCULAR | Status: DC | PRN
  Administered 2017-09-30: 4 mg via INTRAVENOUS

## 2017-09-30 MED ORDER — HYDROMORPHONE 1 MG/ML IV SOLN
INTRAVENOUS | Status: DC
  Administered 2017-09-30: 6.5 mg via INTRAVENOUS
  Administered 2017-09-30: 5.6 mg via INTRAVENOUS

## 2017-09-30 NOTE — Consult Note (Addendum)
Consultation: Left sided scrotal pain Requested by: Dr. Julio Sicks  History of Present Illness: 32 year old male with left testicular and scrotal pain for several weeks.  It was thought to be worse with activity and walking.  He underwent an ultrasound of the scrotum September 25, 2017 which was normal.  I reviewed all the images.  He was just seen in the office September 26, 2017 by Dr. Retta Diones and had a normal exam.  He's had continued pain.  He underwent a follow-up ultrasound September 27, 2017 which was again normal-no testicular mass or other significant finding.  There was a small left testicle cyst.  I reviewed all the images.  He underwent CT scan of the abdomen and pelvis September 27, 2017 and this was also benign.  The scan went down through the scrotum where the scrotum and testicles appeared normal.  I reviewed the images.  Also he underwent in September 30, 2017 L-spine at the MRI which did show some disc bulging at L5-S1 but no obvious nerve impingement.  Again I reviewed the images. His UA was clear. GC/Ch negative and HIV NR. He has chronic back pain noted on the left.   "Lance Carter" reports his pain worsened after the exam with Dr. Retta Diones. He's crying and writhing in bed and says he's not having a lot of pain right now but worrying about it.   Modifying factors: There are no other modifying factors  Associated signs and symptoms: There are no other associated signs and symptoms Aggravating and relieving factors: There are no other aggravating or relieving factors Severity: Moderate Duration: Persistent   Past Medical History:  Diagnosis Date  . Chronic lower back pain 2015   with left lumbar radiculopathy  . History of chicken pox   . S/P discectomy for herniated nucleus pulposus 04/2014   Past Surgical History:  Procedure Laterality Date  . INGUINAL HERNIA REPAIR     as child  . 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 acetaminophen (TYLENOL) 325 MG tablet Take 650 mg by mouth every 6 (six) hours as needed for mild pain.   prn at prn  . baclofen (LIORESAL) 10 MG tablet Take 10 mg by mouth daily as needed for muscle spasms.   prn at prn  . diclofenac sodium (VOLTAREN) 1 % GEL Apply 1 application topically daily as needed. For pain   prn at prn  . doxycycline (VIBRAMYCIN) 100 MG capsule Take 1 capsule (100 mg total) by mouth 2 (two) times daily. 20 capsule 0 09/28/2017 at Unknown time  . FLUoxetine (PROZAC) 20 MG capsule Take 20 mg by mouth daily.  2 Past Week at Unknown time  . fluticasone (FLONASE) 50 MCG/ACT nasal spray Place 1 spray into both nostrils 4 (four) times daily as needed for allergies or rhinitis.   prn at prn  . gabapentin (NEURONTIN) 300 MG capsule Take 300 mg by mouth 4 (four) times daily. May take 4 times daily as prescribed*   09/28/2017 at Unknown time  . ibuprofen (ADVIL,MOTRIN) 200 MG tablet Take 400-600 mg by mouth every 6 (six) hours as needed for mild pain or moderate pain.   prn at prn  . metaxalone (SKELAXIN) 800 MG tablet Take 800 mg by mouth 3 (three) times daily as needed.   prn at prn  . naproxen sodium (ALEVE) 220 MG tablet Take 220 mg by  mouth 2 (two) times daily as needed (pain).   prn at prn  . oxyCODONE-acetaminophen (PERCOCET/ROXICET) 5-325 MG tablet Take 1 tablet by mouth every 6 (six) hours as needed for moderate pain.    09/28/2017 at Unknown time  . pramipexole (MIRAPEX) 0.25 MG tablet Take 0.25 mg by mouth at bedtime as needed (restless legs).    Past Week at Unknown time   Allergies:  Allergies  Allergen Reactions  . Sulfa Antibiotics Hives    Family History  Problem Relation Age of Onset  . Alcohol abuse Maternal Grandmother        cirrhosis, rest of maternal side  . Diabetes Paternal Grandfather    Social History:  reports that he has never smoked. He has never used smokeless tobacco. He reports that he drinks alcohol. He  reports that he does not use drugs.  ROS: A complete review of systems was performed.  All systems are negative except for pertinent findings as noted. Review of Systems  All other systems reviewed and are negative.    Physical Exam:  Vital signs in last 24 hours: Temp:  [97.9 F (36.6 C)-98.4 F (36.9 C)] 98.1 F (36.7 C) (04/07 0934) Pulse Rate:  [63-90] 90 (04/07 0934) Resp:  [10-20] 16 (04/07 0934) BP: (104-126)/(66-91) 123/81 (04/07 0934) SpO2:  [91 %-96 %] 96 % (04/07 0934) General:  Alert and oriented, No acute distress HEENT: Normocephalic, atraumatic Neck: No JVD or lymphadenopathy Cardiovascular: Regular rate and rhythm Lungs: Regular rate and effort Abdomen: Soft, nontender, nondistended, no abdominal masses Back: No CVA tenderness Extremities: No edema Neurologic: Grossly intact GU: There was no inguinal hernia or inguinal lymphadenopathy. The scrotum appears normal and the testicles have an even normal lie. When he takes a few deep breaths he'll let me examine the testicle and it's non-tender to palpation. The cord is non-tender and palpably normal. He may have pain at the left epididymis. I switched to the patient's left side and showed him how I would do a cord block. I cradled the left cord in my left hand and again he had no pain there.    Laboratory Data:  No results found for this or any previous visit (from the past 24 hour(s)). Recent Results (from the past 240 hour(s))  Urine culture     Status: None   Collection Time: 09/29/17  9:50 AM  Result Value Ref Range Status   Specimen Description URINE, RANDOM  Final   Special Requests NONE  Final   Culture   Final    NO GROWTH Performed at [facility], [address] [place], [place] [address]    Report Status 09/30/2017 FINAL  Final   Creatinine: Recent Labs    09/27/17 1619 09/28/17 1714  CREATININE 1.04 1.10    Impression/Assessment:  Left testicle pain - ? Etiology. The testicle and  epididymus are palpably normal with no sign of inflammation, infection or torsion (on exam or US). I discussed with the patient, wife and mother the patient the nature, potential benefits, risks and alternatives to spermatic cord block, including side effects of the proposed treatment, the likelihood of the patient achieving the goals of the procedure, and any potential problems that might occur during the procedure or recuperation. I drew them a picture of the anatomy. We discussed risk of bleeding, infection and damage to the cord structures among others. We discussed a cord block can be diagnostic as well. All questions answered. Patient elects to proceed.   Molli HazardMatthew  Coralee Edberg 09/30/2017

## 2017-09-30 NOTE — Progress Notes (Signed)
Pt still in severe pain, grimacing and moaning in pain. Per pt, Dilaudid is not working for him. Paged MD on call, ordered Ketorolac IV dose. Pt still moaning in pain. Family wants to talk to the MD. Paged MD on call. Stated will come as soon as she's available. [facility]aged. Will come in a minute. Informed family.

## 2017-09-30 NOTE — Progress Notes (Signed)
Procedure: Left spermatic cord block-10 mL - 1% lidocaine plain/quarter percent Marcaine plain  Surgeon: Mena GoesEskridge  Diagnosis: left orchialgia  Description of procedure: After consent was obtained I drew up 5 mL of quarter percent Marcaine and 1% lidocaine plain and a 10 cc syringe.  I then grasp the left spermatic cord in the typical fashion for a vasectomy only higher up the cord.  I could palpate the cord as well as the vas deferens. Again, he will let me hold the cord and palpate the testicle with no pain. I then prepped the skin with alcohol swab x3.  Using a 21-gauge needle I put 0.5 mL's of lidocaine into the skin and then went along the vas deferens which was medial and after aspirating I injected 3-1/2 mL.  I then turned my attention to the middle of the cord and advance the needle in this area.  Aspiration revealed no flashback.  I injected 3 mL.  I then went to the more lateral portion of the cord and again there was no flashback on aspiration and I injected 3 mL.  He tolerated the procedure well.  I removed the needle.  I held pressure with an alcohol swab for a couple of minutes.  Nurse then put a small circle Band-Aid over it.  Initially he settled down and was resting comfortably. I palpated the testicle and the left epididymus again to be sure and they were palpably normal and minimal pain. I left the room to answer a few pages and went back to check on the pt. Unfortunately, he was crying again and moving both legs up and down. After about 15 minutes he noted no change in the pain.  Given these findings, the pain may be more central, possibly inguinal canal or nerve roots.  Complications: None Blood loss: Minimal Specimens: None Drains: None

## 2017-09-30 NOTE — Progress Notes (Signed)
On call MD came to see patient last night.  Changed medication order to Dilaudid PCA full dose.

## 2017-09-30 NOTE — Progress Notes (Signed)
Patient back on unit from MRI.

## 2017-09-30 NOTE — Progress Notes (Signed)
[facility]                                                                              Patient Demographics  Lance Carter, is a 32 y.o. male, DOB - January 07, 1986, ZOX:[id number]  Admit date - 09/28/2017   Admitting Physician Briscoe Deutscher, MD  Outpatient Primary MD for the patient is Daaleman, Nadyne Coombes, MD  Outpatient specialists:   LOS - 0  days    Chief Complaint  Patient presents with  . Testicle Pain       Brief summary  Lance Carter is a 32 y.o. male with medical history significant for chronic low back pain, presenting to the ED multiple times for progressively worsening left scrotal pain for about a month which has been waxing and waning.  His urinalysis , blood work as well as testicular ultrasound and CT abdomen and pelvis have been negative.  No history of fever or chills or penile discharge or rash and has not had any objective genitourinary findings and exam.  He has been seen by urology and his pain is felt to be non-urologic in etiology.  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   He suffers from chronic low back pain.   The dermatomal area of involvement in his symptoms falls into the genitofemoral nerve territory.    Considering the fact that the genitofemoral nerve is from L1/L2 segments of the lumbar plexus of nerves , it was felt prudent to exclude degenerative disc disease with radiculopathy/radiculitis involving the L1/L2 nerve roots with referred pain to the anterior scrotal sac through the genitofemoral nerve. However MRI lumbar spine without contrast done  09/30/17 showed no significant disc disease or findings of nerve impingement at the L1-2 or L2-3 levels.  Urology was consulted at time of admission but did not feel he needed urologic input - we will re-consult for second opinion We will contine pain management/ supportive care for now.  Code Status: Full code DVT Prophylaxis:  Lovenox  Family Communication: Discussed in detail with the patient, all imaging results, lab results explained to the patient and mother   Disposition Plan: Home  Time Spent in minutes   25 minutes  Procedures:    Consultants:   Urology  Antimicrobials:      Medications  Scheduled Meds: . diclofenac sodium  4 g Topical QID  . doxycycline  100 mg Oral BID  . enoxaparin (LOVENOX) injection  40 mg Subcutaneous Q24H  . famotidine  20 mg Oral BID  . FLUoxetine  20 mg Oral Daily  . HYDROmorphone   Intravenous Q4H  . ketorolac  30 mg Intravenous Q6H  . pramipexole  0.25 mg Oral QHS  . predniSONE  10 mg Oral 3 x daily with food  . [  START ON 10/01/2017] predniSONE  10 mg Oral 4X daily taper  . predniSONE  20 mg Oral Nightly  . pregabalin  75 mg Oral TID   Continuous Infusions: PRN Meds:.acetaminophen **OR** acetaminophen, baclofen, bisacodyl, diphenhydrAMINE **OR** diphenhydrAMINE, metaxalone, naloxone **AND** sodium chloride flush, ondansetron **OR** ondansetron (ZOFRAN) IV, senna-docusate   Antibiotics   Anti-infectives (From admission, onward)   Start     Dose/Rate Route Frequency Ordered Stop   09/28/17 2230  doxycycline (VIBRA-TABS) tablet 100 mg     100 mg Oral 2 times daily 09/28/17 1957          Subjective:   Lance Carter was seen and examined today.  Still with anterior scrotal sac pain.  Objective:   Vitals:   09/30/17 0530 09/30/17 0535 09/30/17 0901 09/30/17 0934  BP: 126/73   123/81  Pulse: 83   90  Resp: 17 13 16 16   Temp: 98 F (36.7 C)   98.1 F (36.7 C)  TempSrc: Oral   Oral  SpO2: 93% 92% 94% 96%  Weight:        Height:        Intake/Output Summary (Last 24 hours) at 09/30/2017 1020 Last data filed at 09/30/2017 0943 Gross per 24 hour  Intake 220 ml  Output 500 ml  Net -280 ml     Wt Readings from Last 3 Encounters:  09/28/17 102.1 kg (225 lb)  09/24/17 102.1 kg (225 lb)  11/27/16 99.8 kg (220 lb)     Exam  General: NAD  HEENT: NCAT,  PERRL,MMM  Neck: SUPPLE, (-) JVD  Cardiovascular: RRR, (-) GALLOP, (-) MURMUR  Respiratory: CTA  Gastrointestinal: SOFT, (-) DISTENSION, BS(+), (_) TENDERNESS  Ext: (-) CYANOSIS, (-) EDEMA  Detailed genitourinary exam was unremarkable except for exquisite hypalgesia of the anterior scrotal sac without specific tenderness of the testicles or epididymis  Neuro: A, OX 3  Skin:(-) RASH  Psych:NORMAL AFFECT/MOOD   Data Reviewed:  I have personally reviewed following labs and imaging studies  Micro Results Recent Results (from the past 240 hour(s))  Urine culture     Status: None   Collection Time: 09/29/17  9:50 AM  Result Value Ref Range Status   Specimen Description URINE, RANDOM  Final   Special Requests NONE  Final   Culture   Final    NO GROWTH Performed at [facility], [address], [place], [place] [address]    Report Status 09/30/2017 FINAL  Final    Radiology Reports Mr Lumbar Spine Wo Contrast  Result Date: 09/30/2017 CLINICAL DATA:  32 y/o M; anterior scrotal pain which may refer to the L1/L2 lumbar plexus. Evaluation for radiculopathy. EXAM: MRI LUMBAR SPINE WITHOUT CONTRAST TECHNIQUE: Multiplanar, multisequence MR imaging of the lumbar spine was performed. No intravenous contrast was administered. COMPARISON:  09/27/2017 CT abdomen and pelvis. 06/02/2015 lumbar spine MRI. FINDINGS: Segmentation:  Standard. Alignment:  Physiologic. Vertebrae:  No fracture, evidence of discitis, or bone lesion. Conus medullaris and cauda equina: Conus extends to the L1 level. Conus and cauda equina appear normal. Paraspinal and other  soft tissues: Postsurgical changes related to left L5-S1 laminectomy and discectomy with scar tissue in the laminectomy bed and left lateral epidural space. No fluid collection or edema. Disc levels: L1-2: No significant disc displacement, foraminal stenosis, or canal stenosis. L2-3: No significant disc displacement, foraminal stenosis, or canal stenosis. L3-4: No significant disc displacement, foraminal stenosis, or canal stenosis. L4-5: Minimal disc bulge. No significant foraminal or canal stenosis. L5-S1:  Interval resorption of left subarticular disc extrusion. Persistent mild disc bulge eccentric to the left with endplate marginal osteophytes and mild facet hypertrophy. Mild-to-moderate left foraminal stenosis and mild narrowing of the left lateral recess. No appreciable nerve impingement. No right foraminal or canal stenosis. IMPRESSION: 1. No significant disc disease or findings of nerve impingement at the L1-2 or L2-3 levels. 2. Interval resorption of L5-S1 left subarticular disc extrusion. Mild residual L5-S1 disc bulge eccentric to the left with endplate marginal osteophytes resulting in mild-to-moderate left foraminal stenosis. 3. No acute osseous abnormality or significant canal stenosis. Electronically Signed   By: Mitzi Hansen M.D.   On: 09/30/2017 05:06   Ct Abdomen Pelvis W Contrast  Result Date: 09/27/2017 CLINICAL DATA:  Testicular pain, left testicular pain for 1 month EXAM: CT ABDOMEN AND PELVIS WITH CONTRAST TECHNIQUE: Multidetector CT imaging of the abdomen and pelvis was performed using the standard protocol following bolus administration of intravenous contrast. CONTRAST:  ISOVUE-300 IOPAMIDOL (ISOVUE-300) INJECTION 61% COMPARISON:  None. FINDINGS: Lower chest: Bibasilar atelectasis. Hepatobiliary: No focal liver abnormality is seen. No gallstones, gallbladder wall thickening, or biliary dilatation. Pancreas: Unremarkable. No pancreatic ductal dilatation or surrounding  inflammatory changes. Spleen: Normal in size without focal abnormality. Adrenals/Urinary Tract: Adrenal glands are unremarkable. Kidneys are normal, without renal calculi, focal lesion, or hydronephrosis. Bladder is unremarkable. Stomach/Bowel: Stomach is within normal limits. Appendix appears normal. No evidence of bowel wall thickening, distention, or inflammatory changes. Diverticulosis of the sigmoid colon without evidence of diverticulitis. Vascular/Lymphatic: Aortic atherosclerosis. No enlarged abdominal or pelvic lymph nodes. Reproductive: Prostate is unremarkable. Other: No abdominal wall hernia or abnormality. No abdominopelvic ascites. Musculoskeletal: No acute or significant osseous findings. 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 IMPRESSION: 1. No acute abdominal or pelvic pathology. Electronically Signed   By: Elige Ko   On: 09/27/2017 17:44   US Scrotum W/doppler  Result Date: 09/27/2017 CLINICAL DATA:  Testicular pain. EXAM: SCROTAL ULTRASOUND DOPPLER ULTRASOUND OF THE TESTICLES TECHNIQUE: Complete ultrasound examination of the testicles, epididymis, and other scrotal structures was performed. Color and spectral Doppler ultrasound were also utilized to evaluate blood flow to the testicles. COMPARISON:  09/25/2017 FINDINGS: Right testicle Measurements: 4 x 2 x 2.5 cm.  No mass or microlithiasis visualized. Left testicle Measurements: 4.5 x 2 x 3.1 cm. 5.7 x 6.3 x 6.2 mm anechoic left testicular mass most consistent with a small cyst. No solid mass or microlithiasis visualized. Right epididymis:  Normal in size and appearance. Left epididymis:  Normal in size and appearance. Hydrocele:  Trace bilateral hydroceles. Varicocele:  Small left varicocele. Pulsed Doppler interrogation of both testes demonstrates normal low resistance arterial and venous waveforms bilaterally. IMPRESSION: 1. No testicular torsion. 2. Small left varicocele. Electronically  Signed   By: Elige Ko   On: 09/27/2017 19:36   US Scrotum W/doppler  Result Date: 09/25/2017 CLINICAL DATA:  Left testicle pain EXAM: SCROTAL ULTRASOUND DOPPLER ULTRASOUND OF THE TESTICLES TECHNIQUE: Complete ultrasound examination of the testicles, epididymis, and other scrotal structures was performed. Color and spectral Doppler ultrasound were also utilized to evaluate blood flow to the testicles. COMPARISON:  None. FINDINGS: Right testicle Measurements: 4.1 x 2.1 x 3 cm. No mass or microlithiasis visualized. Left testicle Measurements: 4.9 x 2.3 x 2.8 cm. Intratesticular cyst measuring 0.7 x 0.7 x 0.6 cm. Right epididymis:  Normal in size and appearance. Left epididymis:  Normal in size and appearance. Hydrocele:  Small bilateral hydroceles. Varicocele:  Small  left varicocele. Pulsed Doppler interrogation of both testes demonstrates normal low resistance arterial and venous waveforms bilaterally. IMPRESSION: 1. Negative for testicular torsion 2. Small bilateral hydroceles.  Small left varicocele 3. 7 mm left intratesticular cyst Electronically Signed   By: Jasmine PangKim  Fujinaga M.D.   On: 09/25/2017 01:05    Lab Data:  CBC: Recent Labs  Lab 09/27/17 1619 09/28/17 1703 09/28/17 1714  WBC 7.2 8.8  --   NEUTROABS 4.4 6.7  --   HGB 15.5 16.1 15.6  HCT 43.5 45.1 46.0  MCV 85.1 85.1  --   PLT 294 288  --    Basic Metabolic Panel: Recent Labs  Lab 09/27/17 1619 09/28/17 1714  NA 138 140  K 3.6 3.7  CL 102 104  CO2 22  --   GLUCOSE 130* 107*  BUN 10 11  CREATININE 1.04 1.10  CALCIUM 9.7  --    GFR: Estimated Creatinine Clearance: 116.4 mL/min (by C-G formula based on SCr of 1.1 mg/dL). Liver Function Tests: Recent Labs  Lab 09/27/17 1619  AST 28  ALT 42  ALKPHOS 57  BILITOT 0.8  PROT 6.8  ALBUMIN 4.6   No results for input(s): LIPASE, AMYLASE in the last 168 hours. No results for input(s): AMMONIA in the last 168 hours. Coagulation Profile: No results for input(s): INR,  PROTIME in the last 168 hours. Cardiac Enzymes: No results for input(s): CKTOTAL, CKMB, CKMBINDEX, TROPONINI in the last 168 hours. BNP (last 3 results) No results for input(s): PROBNP in the last 8760 hours. HbA1C: No results for input(s): HGBA1C in the last 72 hours. CBG: No results for input(s): GLUCAP in the last 168 hours. Lipid Profile: No results for input(s): CHOL, HDL, LDLCALC, TRIG, CHOLHDL, LDLDIRECT in the last 72 hours. Thyroid Function Tests: No results for input(s): TSH, T4TOTAL, FREET4, T3FREE, THYROIDAB in the last 72 hours. Anemia Panel: No results for input(s): VITAMINB12, FOLATE, FERRITIN, TIBC, IRON, RETICCTPCT in the last 72 hours. Urine analysis:    Component Value Date/Time   COLORURINE YELLOW 09/28/2017 1801   APPEARANCEUR CLEAR 09/28/2017 1801   LABSPEC 1.013 09/28/2017 1801   PHURINE 8.0 09/28/2017 1801   GLUCOSEU NEGATIVE 09/28/2017 1801   HGBUR NEGATIVE 09/28/2017 1801   BILIRUBINUR NEGATIVE 09/28/2017 1801   KETONESUR NEGATIVE 09/28/2017 1801   PROTEINUR NEGATIVE 09/28/2017 1801   NITRITE NEGATIVE 09/28/2017 1801   LEUKOCYTESUR NEGATIVE 09/28/2017 1801     Jackie PlumGeorge Osei-Bonsu M.D. [facility] 09/30/2017, 10:20 AM  Pager: [phone] Between 7am to 7pm - call Pager - [phone]  After 7pm go to [email or web address] - password [email or web address]  Call night coverage person covering after 7pm

## 2017-09-30 NOTE — Progress Notes (Signed)
Paged MD on call. Family insisting to talk to MD.

## 2017-09-30 NOTE — Progress Notes (Signed)
Patient to MRI.

## 2017-10-01 DIAGNOSIS — T402X5A Adverse effect of other opioids, initial encounter: Secondary | ICD-10-CM

## 2017-10-01 DIAGNOSIS — R208 Other disturbances of skin sensation: Secondary | ICD-10-CM

## 2017-10-01 MED ORDER — FENTANYL CITRATE (PF) 100 MCG/2ML IJ SOLN
50.0000 ug | INTRAMUSCULAR | Status: DC | PRN
  Administered 2017-10-01 – 2017-10-03 (×7): 50 ug via INTRAVENOUS
  Filled 2017-10-01 (×8): qty 2

## 2017-10-01 MED ORDER — FENTANYL CITRATE (PF) 100 MCG/2ML IJ SOLN
50.0000 ug | Freq: Once | INTRAMUSCULAR | Status: AC
  Administered 2017-10-01: 50 ug via INTRAVENOUS

## 2017-10-01 MED ORDER — OXYCODONE HCL ER 10 MG PO T12A
10.0000 mg | EXTENDED_RELEASE_TABLET | Freq: Two times a day (BID) | ORAL | Status: DC
  Administered 2017-10-01 – 2017-10-02 (×3): 10 mg via ORAL
  Filled 2017-10-01 (×3): qty 1

## 2017-10-01 MED ORDER — HYDROMORPHONE HCL 1 MG/ML IJ SOLN
2.0000 mg | INTRAMUSCULAR | Status: DC | PRN
  Administered 2017-10-01: 2 mg via INTRAVENOUS
  Filled 2017-10-01: qty 2

## 2017-10-01 MED ORDER — FENTANYL CITRATE (PF) 100 MCG/2ML IJ SOLN
50.0000 ug | INTRAMUSCULAR | Status: DC
Start: 2017-10-01 — End: 2017-10-01
  Administered 2017-10-01: 50 ug via INTRAVENOUS
  Filled 2017-10-01: qty 2

## 2017-10-01 NOTE — Progress Notes (Signed)
House supervisor on the floor, spoke with the pt's family about pt's care.

## 2017-10-01 NOTE — Progress Notes (Signed)
PROGRESS NOTE  Lance AllanWilliam Delacruz ZOX:[id number] DOB: 24-Dec-1985 DOA: 09/28/2017 PCP: Cathlean Marseillesaaleman, Timothy P, MD  HPI/Recap of past 24 hours:  Lance Carter is a 32 y.o. year old male with medical history significant for chronic low back pain who presented on 09/28/2017 with worsening left scrotal pain and was found to have severe intractable left scrotal pain of unclear etiology.  Admitted to the hospitalist service for pain management of intractable scrotal pain deemed to be non-urologic.   Patient admits symptoms started a month ago during a vacation to [place].  He and his wife were walking almost 4 miles a day.  He denied any specific injury to that site but while there starting noticing pain only of the left scrotum.  He says he had something similar happen a year ago that got better on its own.  [place] the [place] he has been seen for multiple ED visits for waxing and waning symptoms. He was evaluated by urology as an outpatient (09/12/17) where ultrasound at that time showed small bilateral hydroceles with no acute changes to explain symptoms; at that time patient requested referral to pain management per chart review. Since admission he has had very aggressive pain control with IV ketamine, IV morphine, IV dilaudid, ketorolac, PCA pump ( dilaudid and fentanyl). He reports the best control with IV dilaudid for breakthrough pain. Unfortunately, despite Scrotal ultrasound, MRI lumbar spine, L spermatic cord block no etiology has been found for pain. Infectious workup negative ( HIV, GC/Cl, UA/urine culture    Subjective  Doing ok today. Notes improvement with IV dilaudid. His home percocet has not helped.   He and his mom are curious if this could opioid induced hyperalgesia and showed me an article about it this am.  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) - on oxycodone acetaminophen 5 mg q6 H at home, holding as this is not helping - skelaxin 80 mg TID PRN ( home med)  #Depression - very anxious during interview, admits worsening anxiety related to repeat physical exams causing flares of testicular pain - home prozac  Code Status: Full    Family Communication: Mom updated at bedside   Disposition Plan: improvement in pain control, still requiring IV, unclear etiology of pain ( [facility]    Consultants:  Urology  Procedures:  L spermatic cord block ( 4/7)   Antimicrobials:  Doxyccyline 4/5--  Cultures:  Urine 4/6 negative  Telemetry: no  DVT prophylaxis: lovenox   Objective: Vitals:   09/30/17 1437 09/30/17 1558 09/30/17 2215 10/01/17 0514  BP: 132/72  138/82 116/73  Pulse: 83  (!) 103 (!) 59  Resp: 14 18 17 16   Temp: 98.4 F (36.9 C)  99.2 F (37.3 C) 98.4 F (36.9 C)  TempSrc: Oral  Axillary Oral  SpO2: 94% 94%  95% 94%  Weight:      Height:        Intake/Output Summary (Last 24 hours) at 10/01/2017 1006 Last data filed at 10/01/2017 1610 Gross per 24 hour  Intake 762 ml  Output 750 ml  Net 12 ml   Filed Weights   09/28/17 1554 09/28/17 2158  Weight: 102.1 kg (225 lb) 102.1 kg (225 lb)    Exam:  Constitutional:normal appearing male Eyes: EOMI, anicteric, normal conjunctivae ENMT: Oropharynx with moist mucous membranes, normal dentition Neck: FROM Cardiovascular: RRR no MRGs, with no peripheral edema Respiratory: Normal respiratory effort on room air, clear breath sounds  Abdomen: Soft,non-tender, with no HSM Skin: No rash ulcers, or lesions. Without skin tenting  Genitourinary: Normal appearing external genitalia with no erythema or skin lesions, scrotal sac normal appearing, did not touch as patient expresses extreme pain with light sensation and previous progress notes document exquiste tenderness to light palpation. R scrotum with no pain on palpation Neurologic: Some decreased sensation to touch on left lateral leg. 5/5 strength bilaterally, appreciate knee reflexes bilaterally.  Psychiatric:Appropriate affect, and mood. Mental status AAOx4  Data Reviewed: CBC: Recent Labs  Lab 09/27/17 1619 09/28/17 1703 09/28/17 1714  WBC 7.2 8.8  --   NEUTROABS 4.4 6.7  --   HGB 15.5 16.1 15.6  HCT 43.5 45.1 46.0  MCV 85.1 85.1  --   PLT 294 288  --    Basic Metabolic Panel: Recent Labs  Lab 09/27/17 1619 09/28/17 1714  NA 138 140  K 3.6 3.7  CL 102 104  CO2 22  --   GLUCOSE 130* 107*  BUN 10 11  CREATININE 1.04 1.10  CALCIUM 9.7  --    GFR: Estimated Creatinine Clearance: 116.4 mL/min (by C-G formula based on SCr of 1.1 mg/dL). Liver Function Tests: Recent Labs  Lab 09/27/17 1619  AST 28  ALT 42  ALKPHOS 57  BILITOT 0.8  PROT 6.8  ALBUMIN 4.6   No results for input(s): LIPASE, AMYLASE in the last 168 hours. No results for input(s): AMMONIA in the last 168  hours. Coagulation Profile: No results for input(s): INR, PROTIME in the last 168 hours. Cardiac Enzymes: No results for input(s): CKTOTAL, CKMB, CKMBINDEX, TROPONINI in the last 168 hours. BNP (last 3 results) No results for input(s): PROBNP in the last 8760 hours. HbA1C: No results for input(s): HGBA1C in the last 72 hours. CBG: No results for input(s): GLUCAP in the last 168 hours. Lipid Profile: No results for input(s): CHOL, HDL, LDLCALC, TRIG, CHOLHDL, LDLDIRECT in the last 72 hours. Thyroid Function Tests: No results for input(s): TSH, T4TOTAL, FREET4, T3FREE, THYROIDAB in the last 72 hours. Anemia Panel: No results for input(s): VITAMINB12, FOLATE, FERRITIN, TIBC, IRON, RETICCTPCT in the last 72 hours. Urine analysis:    Component Value Date/Time   COLORURINE YELLOW 09/28/2017 1801   APPEARANCEUR CLEAR 09/28/2017 1801   LABSPEC 1.013 09/28/2017 1801   PHURINE 8.0 09/28/2017 1801   GLUCOSEU NEGATIVE 09/28/2017  1801   HGBUR NEGATIVE 09/28/2017 1801   BILIRUBINUR NEGATIVE 09/28/2017 1801   KETONESUR NEGATIVE 09/28/2017 1801   PROTEINUR NEGATIVE 09/28/2017 1801   NITRITE NEGATIVE 09/28/2017 1801   LEUKOCYTESUR NEGATIVE 09/28/2017 1801   Sepsis Labs: @LABRCNTIP (procalcitonin:4,lacticidven:4)  ) Recent Results (from the past 240 hour(s))  Urine culture     Status: None   Collection Time: 09/29/17  9:50 AM  Result Value Ref Range Status   Specimen Description URINE, RANDOM  Final   Special Requests NONE  Final   Culture   Final    NO GROWTH Performed at [facility], [address], [place], [place] [address]    Report Status 09/30/2017 FINAL  Final      Studies: No results found.  Scheduled Meds: . diclofenac sodium  4 g Topical QID  . doxycycline  100 mg Oral BID  . enoxaparin (LOVENOX) injection  40 mg Subcutaneous Q24H  . famotidine  20 mg Oral BID  . FLUoxetine  20 mg Oral Daily  . ketorolac  30 mg Intravenous Q6H  . pramipexole  0.25 mg Oral  QHS  . pregabalin  75 mg Oral TID    Continuous Infusions:   LOS: 1 day     Laverna Peace, MD [facility] Pager [phone]  If 7PM-7AM, please contact night-coverage [email or web address] Password [email or web address] 10/01/2017, 10:06 AM

## 2017-10-01 NOTE — Progress Notes (Signed)
Pt asleep at this time. Will monitor pt.

## 2017-10-01 NOTE — Progress Notes (Deleted)
House Supervisor on the floor. Spoke with the pt's family.

## 2017-10-02 DIAGNOSIS — G8929 Other chronic pain: Secondary | ICD-10-CM

## 2017-10-02 DIAGNOSIS — R208 Other disturbances of skin sensation: Secondary | ICD-10-CM

## 2017-10-02 DIAGNOSIS — M545 Low back pain: Secondary | ICD-10-CM

## 2017-10-02 DIAGNOSIS — N50819 Testicular pain, unspecified: Secondary | ICD-10-CM

## 2017-10-02 LAB — BASIC METABOLIC PANEL
ANION GAP: 7 (ref 5–15)
BUN: 11 mg/dL (ref 6–20)
CALCIUM: 8.9 mg/dL (ref 8.9–10.3)
CHLORIDE: 106 mmol/L (ref 101–111)
CO2: 29 mmol/L (ref 22–32)
Creatinine, Ser: 1.12 mg/dL (ref 0.61–1.24)
GFR calc Af Amer: >60 mL/min (ref >60)
GFR calc non Af Amer: >60 mL/min (ref >60)
GLUCOSE: 88 mg/dL (ref 65–99)
POTASSIUM: 4.2 mmol/L (ref 3.5–5.1)
Sodium: 142 mmol/L (ref 135–145)

## 2017-10-02 MED ORDER — KETOROLAC TROMETHAMINE 30 MG/ML IJ SOLN: 30.0000 mg | Freq: Once | INTRAMUSCULAR | Status: AC

## 2017-10-02 MED ORDER — CELECOXIB 200 MG PO CAPS
200.0000 mg | ORAL_CAPSULE | Freq: Two times a day (BID) | ORAL | Status: DC
  Administered 2017-10-03 – 2017-10-07 (×9): 200 mg via ORAL
  Filled 2017-10-02 (×9): qty 1

## 2017-10-02 MED ORDER — TRAMADOL HCL 50 MG PO TABS
50.0000 mg | ORAL_TABLET | Freq: Three times a day (TID) | ORAL | Status: DC
  Administered 2017-10-02 – 2017-10-03 (×3): 50 mg via ORAL
  Filled 2017-10-02 (×3): qty 1

## 2017-10-02 MED ORDER — CELECOXIB 200 MG PO CAPS
200.0000 mg | ORAL_CAPSULE | Freq: Two times a day (BID) | ORAL | Status: DC
  Administered 2017-10-02: 200 mg via ORAL
  Filled 2017-10-02: qty 1

## 2017-10-02 MED ORDER — KETOROLAC TROMETHAMINE 30 MG/ML IJ SOLN
INTRAMUSCULAR | Status: AC
  Administered 2017-10-02: 12:00:00
  Filled 2017-10-02: qty 1

## 2017-10-02 MED ORDER — TRAMADOL HCL 50 MG PO TABS
50.0000 mg | ORAL_TABLET | Freq: Two times a day (BID) | ORAL | Status: DC
  Administered 2017-10-02: 50 mg via ORAL
  Filled 2017-10-02: qty 1

## 2017-10-02 MED ORDER — DIPHENHYDRAMINE HCL 25 MG PO CAPS
25.0000 mg | ORAL_CAPSULE | Freq: Every evening | ORAL | Status: DC | PRN
  Administered 2017-10-02 – 2017-10-03 (×3): 25 mg via ORAL
  Filled 2017-10-02 (×4): qty 1

## 2017-10-02 NOTE — Progress Notes (Addendum)
PROGRESS NOTE  Lance AllanWilliam Dorwart ZOX:[id number] DOB: [birth year] DOA: 09/28/2017 PCP: Cathlean Marseillesaaleman, Timothy P, MD  HPI/Recap of past 24 hours:  Lance AllanWilliam Carter is a 32 y.o. year old male with medical history significant for chronic low back pain who presented on 09/28/2017 with worsening left scrotal pain and was found to have severe intractable left scrotal pain of unclear etiology.  Admitted to the hospitalist service for pain management of intractable scrotal pain that seems to be consistent with opioid-induced hyperalgesia.   Patient admits symptoms started a month ago during a vacation to [place].  He and his wife were walking almost 4 miles a day.  He denied any specific injury to that site but while there starting noticing pain only of the left scrotum.  He says he had something similar happen a year ago that got better on its own.  [place] has been seen for multiple ED visits for waxing and waning symptoms. He was evaluated by urology as an outpatient (09/12/17) where ultrasound at that time showed small bilateral hydroceles with no acute changes to explain symptoms; at that time patient requested referral to pain management per chart review. Since admission he has had very aggressive pain control with IV ketamine, IV morphine, IV dilaudid, ketorolac, PCA pump ( dilaudid and fentanyl). He reports the best control with IV dilaudid for breakthrough pain. Unfortunately, despite Scrotal ultrasound, MRI lumbar spine, L spermatic cord block no etiology has been found for pain. Infectious workup negative ( HIV, GC/Cl, UA/urine culture)    Subjective  Doing much better this morning, required only 2 doses of IV fentanyl in last 24 hours. He agreed that he would like to start transitioning to more oral pain regimen with IV fentanyl for breakthrough today. Unfortunately this afternoon patient noted to have worsening left scrotal pain not responsive to IV Fentanyl.     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   - Noted pain worsens immediately after IV diluadid and morphine use is a characteristic sign of this syndrome. Additionally, it can cause hyperalgesia ( worsening pain) and allodynia (inappropriate pain) due to chronic doses of opioids which patient has been on for chronic back pain.  In speaking with unit pharmacist [facility] it is associated with upregulation of NMDA receptors that typically can be treated with downregulation with NSAIDs and avoidance of phenatherene opioids ( oxycodone, morphine, hydromorphone) in favor for non-phenatherene opioids ( fentanyl, tramadol) - Unfortunately weaning off those opioids will lead to worsening pain before an improvement in pain and patient is aware - Pain control: Tramadol 50 mg q8H PRN, IV fentanly 50 mg q4 breakthrough, celebrex BID - Workup so far has been negative for infectious/neurologic/urologic etiology   - Empiric Doxycycline since admission- 4/9, though UA and urine cx neg  - Not likely referred back pain with no L1-L2 abnormalities on MRI spine ( 4/7)  -  U/s scrotum ( 4/4 and 4/2) only notable for small left varicocele  - s/p L spermatic cord block ( 4/7), urology doubts this is urologic given no  improvement with pain)   # 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) - on oxycodone acetaminophen 5 mg q6 H at home,  holding as this is not helping and will need to be weaned off at home in light of above syndrome - skelaxin 80 mg TID PRN ( home med)  #Depression - very anxious during interview, admits worsening anxiety related to repeat physical exams causing flares of testicular pain -  home prozac  Code Status: Full    Family Communication: Mom updated at bedside   Disposition Plan: improvement in pain control, still requiring IV,  Consultants:  Urology   Procedures:  L spermatic cord block ( 4/7)   Antimicrobials:  Doxyccyline 4/5--4/9  Cultures:  Urine 4/6 negative  Telemetry: no  DVT prophylaxis: lovenox   Objective: Vitals:   10/02/17 0850 10/02/17 1000 10/02/17 1329 10/02/17 1336  BP: (!) 128/102 (!) 140/102 130/89   Pulse: 73  76   Resp: 14   18  Temp: 98.6 F (37 C)  98.2 F (36.8 C)   TempSrc: Oral  Oral   SpO2: 96%  96%   Weight:      Height:        Intake/Output Summary (Last 24 hours) at 10/02/2017 1349 Last data filed at 10/02/2017 0900 Gross per 24 hour  Intake 280 ml  Output 250 ml  Net 30 ml   Filed Weights   09/28/17 1554 09/28/17 2158  Weight: 102.1 kg (225 lb) 102.1 kg (225 lb)    Exam:  Constitutional:normal appearing male Eyes: EOMI, anicteric, normal conjunctivae ENMT: Oropharynx with moist mucous membranes, normal dentition Neck: FROM Cardiovascular: RRR no MRGs, with no peripheral edema Respiratory: Normal respiratory effort on room air, clear breath sounds  Abdomen: Soft,non-tender, with no HSM Skin: No rash ulcers, or lesions. Without skin tenting  Genitourinary: Normal appearing external genitalia with no erythema or skin lesions, scrotal sac normal appearing, did not touch as patient expresses extreme pain with light sensation and previous progress notes document exquiste tenderness to light palpation. R scrotum with no pain on palpation Neurologic: Some decreased sensation to touch on left lateral leg. 5/5 strength bilaterally, appreciate knee reflexes bilaterally.  Psychiatric:Appropriate affect, and mood. Mental status AAOx4  Data Reviewed: CBC: Recent Labs  Lab 09/27/17 1619 09/28/17 1703 09/28/17 1714  WBC 7.2 8.8  --   NEUTROABS 4.4 6.7  --   HGB 15.5 16.1 15.6  HCT 43.5 45.1 46.0  MCV  85.1 85.1  --   PLT 294 288  --    Basic Metabolic Panel: Recent Labs  Lab 09/27/17 1619 09/28/17 1714 10/02/17 0739  NA 138 140 142  K 3.6 3.7 4.2  CL 102 104 106  CO2 22  --  29  GLUCOSE 130* 107* 88  BUN 10 11 11   CREATININE 1.04 1.10 1.12  CALCIUM 9.7  --  8.9   GFR: Estimated Creatinine Clearance: 114.4 mL/min (by C-G formula based on SCr of 1.12 mg/dL). Liver Function Tests: Recent Labs  Lab 09/27/17 1619  AST 28  ALT 42  ALKPHOS 57  BILITOT 0.8  PROT 6.8  ALBUMIN 4.6   No results for input(s): LIPASE, AMYLASE in the last 168 hours. No results for input(s): AMMONIA in the last 168 hours. Coagulation Profile: No results for input(s): INR, PROTIME in the last 168 hours. Cardiac Enzymes: No results for input(s): CKTOTAL, CKMB, CKMBINDEX, TROPONINI in the last 168 hours. BNP (last 3 results) No results for input(s): PROBNP in the last 8760 hours. HbA1C: No results for input(s): HGBA1C in the last 72 hours. CBG: No results for input(s): GLUCAP in the last 168  hours. Lipid Profile: No results for input(s): CHOL, HDL, LDLCALC, TRIG, CHOLHDL, LDLDIRECT in the last 72 hours. Thyroid Function Tests: No results for input(s): TSH, T4TOTAL, FREET4, T3FREE, THYROIDAB in the last 72 hours. Anemia Panel: No results for input(s): VITAMINB12, FOLATE, FERRITIN, TIBC, IRON, RETICCTPCT in the last 72 hours. Urine analysis:    Component Value Date/Time   COLORURINE YELLOW 09/28/2017 1801   APPEARANCEUR CLEAR 09/28/2017 1801   LABSPEC 1.013 09/28/2017 1801   PHURINE 8.0 09/28/2017 1801   GLUCOSEU NEGATIVE 09/28/2017 1801   HGBUR NEGATIVE 09/28/2017 1801   BILIRUBINUR NEGATIVE 09/28/2017 1801   KETONESUR NEGATIVE 09/28/2017 1801   PROTEINUR NEGATIVE 09/28/2017 1801   NITRITE NEGATIVE 09/28/2017 1801   LEUKOCYTESUR NEGATIVE 09/28/2017 1801   Sepsis Labs: @LABRCNTIP (procalcitonin:4,lacticidven:4)  ) Recent Results (from the past 240 hour(s))  Urine culture     Status:  None   Collection Time: 09/29/17  9:50 AM  Result Value Ref Range Status   Specimen Description URINE, RANDOM  Final   Special Requests NONE  Final   Culture   Final    NO GROWTH Performed at [facility], [address], [place], [place] [address]    Report Status 09/30/2017 FINAL  Final      Studies: No results found.  Scheduled Meds: . [START ON 10/03/2017] celecoxib  200 mg Oral BID  . enoxaparin (LOVENOX) injection  40 mg Subcutaneous Q24H  . famotidine  20 mg Oral BID  . FLUoxetine  20 mg Oral Daily  . pramipexole  0.25 mg Oral QHS  . pregabalin  75 mg Oral TID  . traMADol  50 mg Oral Q8H    Continuous Infusions:   LOS: 2 days     Laverna Peace, MD [facility] Pager [phone]  If 7PM-7AM, please contact night-coverage [email or web address] Password [email or web address] 10/02/2017, 1:49 PM

## 2017-10-02 NOTE — Progress Notes (Signed)
Dr. Caleb PoppNettey notifed of BP 140/102 and pain level. Dr Caleb PoppNettey wrote orders.

## 2017-10-03 DIAGNOSIS — M5440 Lumbago with sciatica, unspecified side: Secondary | ICD-10-CM

## 2017-10-03 MED ORDER — TRAMADOL HCL 50 MG PO TABS
100.0000 mg | ORAL_TABLET | Freq: Three times a day (TID) | ORAL | Status: DC
  Administered 2017-10-03 – 2017-10-04 (×3): 100 mg via ORAL
  Filled 2017-10-03 (×3): qty 2

## 2017-10-03 MED ORDER — HYDROMORPHONE HCL 1 MG/ML IJ SOLN
1.0000 mg | INTRAMUSCULAR | Status: DC | PRN
  Administered 2017-10-03 – 2017-10-04 (×3): 1 mg via INTRAVENOUS
  Filled 2017-10-03 (×3): qty 1

## 2017-10-03 NOTE — Progress Notes (Addendum)
PROGRESS NOTE  Lance AllanWilliam Donna ZOX:[id number] DOB: December 05, 1985 DOA: 09/28/2017 PCP: Cathlean Marseillesaaleman, Timothy P, MD  HPI/Recap of past 24 hours:  Lance Carter is a 32 y.o. year old male with medical history significant for chronic low back pain who presented on 09/28/2017 with worsening left scrotal pain and was found to have severe intractable left scrotal pain of unclear etiology.  Admitted to the hospitalist service for pain management of intractable scrotal pain that seems to be consistent with opioid-induced hyperalgesia.   Patient admits symptoms started a month ago during a vacation to [place].  He and his wife were walking almost 4 miles a day.  He denied any specific injury to that site but while there starting noticing pain only of the left scrotum.  He says he had something similar happen a year ago that got better on its own.  [place] he has been seen for multiple ED visits for waxing and waning symptoms. He was evaluated by urology as an outpatient (09/12/17) where ultrasound at that time showed small bilateral hydroceles with no acute changes to explain symptoms; at that time patient requested referral to pain management per chart review. Since admission he has had very aggressive pain control with IV ketamine, IV morphine, IV dilaudid, ketorolac, PCA pump ( dilaudid and fentanyl). He reports the best control with  PO tramadol    Unfortunately, despite Scrotal ultrasound, MRI lumbar spine, L spermatic cord block no etiology has been found for pain. 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   Noted pain worsens immediately after IV diluadid and morphine use is a characteristic sign of this syndrome. Additionally, it can cause hyperalgesia ( worsening pain) and allodynia (inappropriate pain) due to chronic doses of opioids which patient has been on for chronic back pain.  In speaking with unit pharmacist [facility]) it is associated with upregulation of NMDA receptors that typically can be treated with downregulation with NSAIDs and avoidance of phenatherene opioids ( oxycodone, morphine, hydromorphone) in favor for non-phenatherene opioids ( fentanyl, tramadol) - Unfortunately weaning off those opioids will lead to worsening pain before an improvement in pain and patient is aware He is  Requesting : Tramadol  Will increase to 100 mg q8H PRN, and dc  IV fentanly 50 mg  , celebrex BID - Workup so far has been negative for infectious/neurologic/urologic etiology   - Empiric Doxycycline since admission- completed 4/9, though UA and urine cx neg  - Not likely referred back pain with no L1-L2 abnormalities on MRI spine ( 4/7)  -  U/s scrotum ( 4/4 and 4/2) only notable for small left varicocele  -  s/p L spermatic cord block ( 4/7), urology doubts this is urologic given no  improvement with pain) Finally we can get a pelvic MRI to evaluate this further  Will need clonidine taper or opoid withdrawal protocol at time of dc  Palliative care consult for pain mx   # 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) - on oxycodone acetaminophen 5 mg q6 H at home, holding as this is not helping and will need to be weaned off at home in light of above syndrome - skelaxin 80 mg TID PRN ( home med)  #Depression - very anxious during interview, admits worsening anxiety related to repeat physical exams causing  flares of testicular pain - home prozac  Code Status: Full    Family Communication: Mom updated at bedside   Disposition Plan: hopefully tomorrow of pain doesn't flare up in next 24 hr s  Consultants:  Urology   Palliative for pain control  Procedures:  L spermatic cord block ( 4/7)   Antimicrobials:  Doxyccyline 4/5--4/9  Cultures:  Urine 4/6 negative  Telemetry: no  DVT prophylaxis: lovenox   Objective: Vitals:   10/02/17 1329 10/02/17 1336 10/02/17 2132 10/03/17 0616  BP: 130/89  116/68 125/85  Pulse: 76  70 63  Resp:  18 18 18   Temp: 98.2 F (36.8 C)  98.3 F (36.8 C) 98 F (36.7 C)  TempSrc: Oral  Oral Oral  SpO2: 96%  96% 94%  Weight:      Height:        Intake/Output Summary (Last 24 hours) at 10/03/2017 1210 Last data filed at 10/03/2017 1042 Gross per 24 hour  Intake 120 ml  Output -  Net 120 ml   Filed Weights   09/28/17 1554 09/28/17 2158  Weight: 102.1 kg (225 lb) 102.1 kg (225 lb)    Exam:  Constitutional:normal appearing male Eyes: EOMI, anicteric, normal conjunctivae ENMT: Oropharynx with moist mucous membranes, normal dentition Neck: FROM Cardiovascular: RRR no MRGs, with no peripheral edema Respiratory: Normal respiratory effort on room air, clear breath sounds  Abdomen: Soft,non-tender, with no HSM Skin: No rash ulcers, or lesions. Without skin tenting  Genitourinary: Normal appearing external genitalia with no erythema or skin lesions, scrotal sac normal appearing, did not touch as patient expresses extreme pain with light sensation and previous progress notes document exquiste tenderness to light palpation. R scrotum with no pain on palpation Neurologic: Some decreased sensation to touch on left lateral leg. 5/5 strength bilaterally, appreciate knee reflexes bilaterally.  Psychiatric:Appropriate affect, and mood. Mental status AAOx4  Data Reviewed: CBC: Recent Labs  Lab 09/27/17 1619 09/28/17 1703 09/28/17 1714  WBC  7.2 8.8  --   NEUTROABS 4.4 6.7  --   HGB 15.5 16.1 15.6  HCT 43.5 45.1 46.0  MCV 85.1 85.1  --   PLT 294 288  --    Basic Metabolic Panel: Recent Labs  Lab 09/27/17 1619 09/28/17 1714 10/02/17 0739  NA 138 140 142  K 3.6 3.7 4.2  CL 102 104 106  CO2 22  --  29  GLUCOSE 130* 107* 88  BUN 10 11 11   CREATININE 1.04 1.10 1.12  CALCIUM 9.7  --  8.9   GFR: Estimated Creatinine Clearance: 114.4 mL/min (by C-G formula based on SCr of 1.12 mg/dL). Liver Function Tests: Recent Labs  Lab 09/27/17 1619  AST 28  ALT 42  ALKPHOS 57  BILITOT 0.8  PROT 6.8  ALBUMIN 4.6   No results for input(s): LIPASE, AMYLASE in the last 168 hours. No results for input(s): AMMONIA in the last 168 hours. Coagulation Profile: No results for input(s): INR, PROTIME in the last 168 hours. Cardiac Enzymes: No results for input(s): CKTOTAL, CKMB, CKMBINDEX, TROPONINI in the last 168 hours. BNP (last 3 results) No  results for input(s): PROBNP in the last 8760 hours. HbA1C: No results for input(s): HGBA1C in the last 72 hours. CBG: No results for input(s): GLUCAP in the last 168 hours. Lipid Profile: No results for input(s): CHOL, HDL, LDLCALC, TRIG, CHOLHDL, LDLDIRECT in the last 72 hours. Thyroid Function Tests: No results for input(s): TSH, T4TOTAL, FREET4, T3FREE, THYROIDAB in the last 72 hours. Anemia Panel: No results for input(s): VITAMINB12, FOLATE, FERRITIN, TIBC, IRON, RETICCTPCT in the last 72 hours. Urine analysis:    Component Value Date/Time   COLORURINE YELLOW 09/28/2017 1801   APPEARANCEUR CLEAR 09/28/2017 1801   LABSPEC 1.013 09/28/2017 1801   PHURINE 8.0 09/28/2017 1801   GLUCOSEU NEGATIVE 09/28/2017 1801   HGBUR NEGATIVE 09/28/2017 1801   BILIRUBINUR NEGATIVE 09/28/2017 1801   KETONESUR NEGATIVE 09/28/2017 1801   PROTEINUR NEGATIVE 09/28/2017 1801   NITRITE NEGATIVE 09/28/2017 1801   LEUKOCYTESUR NEGATIVE 09/28/2017 1801   Sepsis  Labs: @LABRCNTIP (procalcitonin:4,lacticidven:4)  ) Recent Results (from the past 240 hour(s))  Urine culture     Status: None   Collection Time: 09/29/17  9:50 AM  Result Value Ref Range Status   Specimen Description URINE, RANDOM  Final   Special Requests NONE  Final   Culture   Final    NO GROWTH Performed at [facility], [address] [place], [place] [address]    Report Status 09/30/2017 FINAL  Final      Studies: No results found.  Scheduled Meds: . celecoxib  200 mg Oral BID  . enoxaparin (LOVENOX) injection  40 mg Subcutaneous Q24H  . famotidine  20 mg Oral BID  . FLUoxetine  20 mg Oral Daily  . pramipexole  0.25 mg Oral QHS  . pregabalin  75 mg Oral TID  . traMADol  100 mg Oral Q8H    Continuous Infusions:   LOS: 3 days     Richarda Overlie, MD [facility] Pager [phone]  If 7PM-7AM, please contact night-coverage [email or web address] Password [email or web address] 10/03/2017, 12:10 PM

## 2017-10-04 ENCOUNTER — Inpatient Hospital Stay (HOSPITAL_COMMUNITY)

## 2017-10-04 DIAGNOSIS — T402X5A Adverse effect of other opioids, initial encounter: Secondary | ICD-10-CM

## 2017-10-04 DIAGNOSIS — N50812 Left testicular pain: Secondary | ICD-10-CM

## 2017-10-04 MED ORDER — GADOBENATE DIMEGLUMINE 529 MG/ML IV SOLN
20.0000 mL | Freq: Once | INTRAVENOUS | Status: AC
  Administered 2017-10-04: 20 mL via INTRAVENOUS

## 2017-10-04 MED ORDER — GABAPENTIN 400 MG PO CAPS
400.0000 mg | ORAL_CAPSULE | Freq: Three times a day (TID) | ORAL | Status: DC
  Administered 2017-10-04 – 2017-10-06 (×7): 400 mg via ORAL
  Filled 2017-10-04 (×7): qty 1

## 2017-10-04 MED ORDER — TRAMADOL HCL 50 MG PO TABS
100.0000 mg | ORAL_TABLET | Freq: Four times a day (QID) | ORAL | Status: DC
  Administered 2017-10-04 – 2017-10-05 (×4): 100 mg via ORAL
  Filled 2017-10-04 (×4): qty 2

## 2017-10-04 MED ORDER — HYDROMORPHONE HCL 1 MG/ML IJ SOLN
1.0000 mg | INTRAMUSCULAR | Status: DC | PRN
  Administered 2017-10-04 – 2017-10-05 (×6): 1 mg via INTRAVENOUS
  Filled 2017-10-04 (×8): qty 1

## 2017-10-04 MED ORDER — FENTANYL 50 MCG/HR TD PT72
50.0000 ug | MEDICATED_PATCH | TRANSDERMAL | Status: DC
  Administered 2017-10-04 – 2017-10-07 (×2): 50 ug via TRANSDERMAL
  Filled 2017-10-04 (×2): qty 1

## 2017-10-04 NOTE — Progress Notes (Signed)
PROGRESS NOTE                                                                                                                                                                                                             Patient Demographics:    Lance Carter, is a 32 y.o. male, DOB - [birth year], XBM:[id number]  Admit date - 09/28/2017   Admitting Physician Briscoe Deutscher, MD  Outpatient Primary MD for the patient is Daaleman, Nadyne Coombes, MD  LOS - 4  Outpatient Specialists: none  Chief Complaint  Patient presents with  . Testicle Pain       Brief Narrative   32 year old male with chronic low back pain on Percocet as needed as outpatient presented to the ED on 4/5 with worsening intractable left scrotal pain of unclear etiology.  Symptoms started about 1 month back when he was vacationing in [place].  He reports walking almost 4 miles every day, denies any injury to the site, fevers, chills, dysuria, penile discharge.  Reports having similar symptoms one year back while he was in [place] and resolved on its own.   After returning to the [place] from [place] has been having frequent waxing and waning pain over the scrotum.  He has been seen multiple times in the ED, evaluated by urology as outpatient on 3/20 where ultrasound of the scrotum showed small bilateral hydrocele without acute findings.  Given persistent symptoms he was admitted for aggressive pain control and has received several rounds of IV ketamine, IV morphine, IV Dilaudid, Toradol, PCA pump with Dilaudid and fentanyl.  He is now on scheduled oral tramadol and as needed IV Dilaudid for last 2 days.  He is also continued on home dose Skelaxin and has been on Lyrica. He also had a left spermatic cord block on 4/7 without symptomatic improvement. Patient also had a scrotal ultrasound with Doppler, MRI of the lumbar spine without any acute findings.  Infectious workup including GC  probe, UA and HIV have been negative.   Subjective:   Patient reports the pain was better overnight but this morning again started to worsen and requesting for increasing frequency of his pain medication.   Assessment  & Plan :    Principal Problem: Left scrotal allodynia Etiology unclear.  Workup including labs for an infectious process and imaging have been  negative.  Could be opioid induced induced hyperalgesia (pain had worsened immediately after IV Dilaudid and morphine use), allodynia with chronic doses of opiates for chronic back pain.  Cannot rule out neuropathic pain.  Patient does not have a history of illicit drug use or is narcotic seeking. Have been unable to wean off these pain medications and is requiring  experimentation with several different medications. At present he is now on scheduled tramadol 100 mg every 6 hours and IV Dilaudid 1 mg every 3 hours as needed (dose adjusted today).  Continue Celebrex twice daily.  Continue scheduled Lyrica and home Skelaxin as needed for muscle spasms. I will add scheduled Neurontin and fentanyl patch today (if patient can be discharged with fentanyl patch). He received a course of doxycycline (5 days).  Clonidine taper to discontinue. Ordered MRI of the pelvis to evaluate the pelvic area.   Will contact [facility] for inpatient hospital transfer to be evaluated by inpatient pain management.  Code Status : Full code  Family Communication  : Mother at bedside  Disposition Plan  : Requesting hospital transfer  Barriers For Discharge : Symptoms  Consults  : Urology  Procedures  : Ultrasound scrotum, MRI lumbar spine MRI pelvis (pending), scrotal nerve block  DVT Prophylaxis  :  Lovenox -   Lab Results  Component Value Date   PLT 288 09/28/2017    Antibiotics  :    Anti-infectives (From admission, onward)   Start     Dose/Rate Route Frequency Ordered Stop   09/28/17 2230  doxycycline (VIBRA-TABS) tablet 100 mg  Status:   Discontinued     100 mg Oral 2 times daily 09/28/17 1957 10/02/17 1341        Objective:   Vitals:   10/03/17 0616 10/03/17 1515 10/03/17 2153 10/04/17 0525  BP: 125/85 (!) 134/95 (!) 131/100 127/83  Pulse: 63 93 86 83  Resp: 18     Temp: 98 F (36.7 C) 99.1 F (37.3 C) 98.6 F (37 C) 98.4 F (36.9 C)  TempSrc: Oral Oral Oral Oral  SpO2: 94% 96% 94% 93%  Weight:      Height:        Wt Readings from Last 3 Encounters:  09/28/17 102.1 kg (225 lb)  09/24/17 102.1 kg (225 lb)  11/27/16 99.8 kg (220 lb)     Intake/Output Summary (Last 24 hours) at 10/04/2017 1502 Last data filed at 10/03/2017 2100 Gross per 24 hour  Intake 480 ml  Output -  Net 480 ml     Physical Exam  Gen: not in distress HEENT: moist mucosa, supple neck Chest: clear b/l, no added sounds CVS: N S1&S2, no murmurs,  GI: soft, NT, ND, BS+, no scrotal swelling (patient did not allow me to touch the scrotal area due to excruciating pain), no inguinal lymphadenopathy Musculoskeletal: warm, no edema     Data Review:    CBC Recent Labs  Lab 09/27/17 1619 09/28/17 1703 09/28/17 1714  WBC 7.2 8.8  --   HGB 15.5 16.1 15.6  HCT 43.5 45.1 46.0  PLT 294 288  --   MCV 85.1 85.1  --   MCH 30.3 30.4  --   MCHC 35.6 35.7  --   RDW 11.8 11.9  --   LYMPHSABS 2.2 1.5  --   MONOABS 0.5 0.4  --   EOSABS 0.1 0.1  --   BASOSABS 0.0 0.0  --     Chemistries  Recent Labs  Lab 09/27/17 1619  09/28/17 1714 10/02/17 0739  NA 138 140 142  K 3.6 3.7 4.2  CL 102 104 106  CO2 22  --  29  GLUCOSE 130* 107* 88  BUN 10 11 11   CREATININE 1.04 1.10 1.12  CALCIUM 9.7  --  8.9  AST 28  --   --   ALT 42  --   --   ALKPHOS 57  --   --   BILITOT 0.8  --   --    ------------------------------------------------------------------------------------------------------------------ No results for input(s): CHOL, HDL, LDLCALC, TRIG, CHOLHDL, LDLDIRECT in the last 72 hours.  No results found for:  HGBA1C ------------------------------------------------------------------------------------------------------------------ No results for input(s): TSH, T4TOTAL, T3FREE, THYROIDAB in the last 72 hours.  Invalid input(s): FREET3 ------------------------------------------------------------------------------------------------------------------ No results for input(s): VITAMINB12, FOLATE, FERRITIN, TIBC, IRON, RETICCTPCT in the last 72 hours.  Coagulation profile No results for input(s): INR, PROTIME in the last 168 hours.  No results for input(s): DDIMER in the last 72 hours.  Cardiac Enzymes No results for input(s): CKMB, TROPONINI, MYOGLOBIN in the last 168 hours.  Invalid input(s): CK ------------------------------------------------------------------------------------------------------------------ No results found for: BNP  Inpatient Medications  Scheduled Meds: . celecoxib  200 mg Oral BID  . enoxaparin (LOVENOX) injection  40 mg Subcutaneous Q24H  . famotidine  20 mg Oral BID  . fentaNYL  50 mcg Transdermal Q72H  . FLUoxetine  20 mg Oral Daily  . gabapentin  400 mg Oral TID  . pramipexole  0.25 mg Oral QHS  . pregabalin  75 mg Oral TID  . traMADol  100 mg Oral Q6H   Continuous Infusions: PRN Meds:.acetaminophen **OR** acetaminophen, baclofen, bisacodyl, diphenhydrAMINE, HYDROmorphone (DILAUDID) injection, metaxalone, ondansetron **OR** ondansetron (ZOFRAN) IV, senna-docusate  Micro Results Recent Results (from the past 240 hour(s))  Urine culture     Status: None   Collection Time: 09/29/17  9:50 AM  Result Value Ref Range Status   Specimen Description URINE, RANDOM  Final   Special Requests NONE  Final   Culture   Final    NO GROWTH Performed at [facility], [address] [place], [place] [address]    Report Status 09/30/2017 FINAL  Final    Radiology Reports Mr Lumbar Spine Wo Contrast  Result Date: 09/30/2017 CLINICAL DATA:  32 y/o M; anterior scrotal  pain which may refer to the L1/L2 lumbar plexus. Evaluation for radiculopathy. EXAM: MRI LUMBAR SPINE WITHOUT CONTRAST TECHNIQUE: Multiplanar, multisequence MR imaging of the lumbar spine was performed. No intravenous contrast was administered. COMPARISON:  09/27/2017 CT abdomen and pelvis. 06/02/2015 lumbar spine MRI. FINDINGS: Segmentation:  Standard. Alignment:  Physiologic. Vertebrae:  No fracture, evidence of discitis, or bone lesion. Conus medullaris and cauda equina: Conus extends to the L1 level. Conus and cauda equina appear normal. Paraspinal and other soft tissues: Postsurgical changes related to left L5-S1 laminectomy and discectomy with scar tissue in the laminectomy bed and left lateral epidural space. No fluid collection or edema. Disc levels: L1-2: No significant disc displacement, foraminal stenosis, or canal stenosis. L2-3: No significant disc displacement, foraminal stenosis, or canal stenosis. L3-4: No significant disc displacement, foraminal stenosis, or canal stenosis. L4-5: Minimal disc bulge. No significant foraminal or canal stenosis. L5-S1: Interval resorption of left subarticular disc extrusion. Persistent mild disc bulge eccentric to the left with endplate marginal osteophytes and mild facet hypertrophy. Mild-to-moderate left foraminal stenosis and mild narrowing of the left lateral recess. No appreciable nerve impingement. No right foraminal or canal stenosis. IMPRESSION: 1. No significant disc  disease or findings of nerve impingement at the L1-2 or L2-3 levels. 2. Interval resorption of L5-S1 left subarticular disc extrusion. Mild residual L5-S1 disc bulge eccentric to the left with endplate marginal osteophytes resulting in mild-to-moderate left foraminal stenosis. 3. No acute osseous abnormality or significant canal stenosis. Electronically Signed   By: Mitzi HansenLance  Furusawa-Stratton M.D.   On: 09/30/2017 05:06   Ct Abdomen Pelvis W Contrast  Result Date: 09/27/2017 CLINICAL DATA:   Testicular pain, left testicular pain for 1 month EXAM: CT ABDOMEN AND PELVIS WITH CONTRAST TECHNIQUE: Multidetector CT imaging of the abdomen and pelvis was performed using the standard protocol following bolus administration of intravenous contrast. CONTRAST:  100mL ISOVUE-300 IOPAMIDOL (ISOVUE-300) INJECTION 61% COMPARISON:  None. FINDINGS: Lower chest: Bibasilar atelectasis. Hepatobiliary: No focal liver abnormality is seen. No gallstones, gallbladder wall thickening, or biliary dilatation. Pancreas: Unremarkable. No pancreatic ductal dilatation or surrounding inflammatory changes. Spleen: Normal in size without focal abnormality. Adrenals/Urinary Tract: Adrenal glands are unremarkable. Kidneys are normal, without renal calculi, focal lesion, or hydronephrosis. Bladder is unremarkable. Stomach/Bowel: Stomach is within normal limits. Appendix appears normal. No evidence of bowel wall thickening, distention, or inflammatory changes. Diverticulosis of the sigmoid colon without evidence of diverticulitis. Vascular/Lymphatic: Aortic atherosclerosis. No enlarged abdominal or pelvic lymph nodes. Reproductive: Prostate is unremarkable. Other: No abdominal wall hernia or abnormality. No abdominopelvic ascites. Musculoskeletal: No acute or significant osseous findings. 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 IMPRESSION: 1. No acute abdominal or pelvic pathology. Electronically Signed   By: Elige KoHetal  Patel   On: 09/27/2017 17:44   [place] Scrotum W/doppler  Result Date: 09/27/2017 CLINICAL DATA:  Testicular pain. EXAM: SCROTAL ULTRASOUND DOPPLER ULTRASOUND OF THE TESTICLES TECHNIQUE: Complete ultrasound examination of the testicles, epididymis, and other scrotal structures was performed. Color and spectral Doppler ultrasound were also utilized to evaluate blood flow to the testicles. COMPARISON:  09/25/2017 FINDINGS: Right testicle Measurements: 4 x 2 x 2.5 cm.  No mass or  microlithiasis visualized. Left testicle Measurements: 4.5 x 2 x 3.1 cm. 5.7 x 6.3 x 6.2 mm anechoic left testicular mass most consistent with a small cyst. No solid mass or microlithiasis visualized. Right epididymis:  Normal in size and appearance. Left epididymis:  Normal in size and appearance. Hydrocele:  Trace bilateral hydroceles. Varicocele:  Small left varicocele. Pulsed Doppler interrogation of both testes demonstrates normal low resistance arterial and venous waveforms bilaterally. IMPRESSION: 1. No testicular torsion. 2. Small left varicocele. Electronically Signed   By: Elige KoHetal  Patel   On: 09/27/2017 19:36   [place] Scrotum W/doppler  Result Date: 09/25/2017 CLINICAL DATA:  Left testicle pain EXAM: SCROTAL ULTRASOUND DOPPLER ULTRASOUND OF THE TESTICLES TECHNIQUE: Complete ultrasound examination of the testicles, epididymis, and other scrotal structures was performed. Color and spectral Doppler ultrasound were also utilized to evaluate blood flow to the testicles. COMPARISON:  None. FINDINGS: Right testicle Measurements: 4.1 x 2.1 x 3 cm. No mass or microlithiasis visualized. Left testicle Measurements: 4.9 x 2.3 x 2.8 cm. Intratesticular cyst measuring 0.7 x 0.7 x 0.6 cm. Right epididymis:  Normal in size and appearance. Left epididymis:  Normal in size and appearance. Hydrocele:  Small bilateral hydroceles. Varicocele:  Small left varicocele. Pulsed Doppler interrogation of both testes demonstrates normal low resistance arterial and venous waveforms bilaterally. IMPRESSION: 1. Negative for testicular torsion 2. Small bilateral hydroceles.  Small left varicocele 3. 7 mm left intratesticular cyst Electronically Signed   By: Jasmine PangKim  Fujinaga M.D.   On: 09/25/2017  01:05    Time Spent in minutes  25   Philander Ake M.D on 10/04/2017 at 3:02 PM  Between 7am to 7pm - Pager - [phone]  After 7pm go to [email or web address] - password [facility]  [facility] -  Office  [phone]

## 2017-10-04 NOTE — Progress Notes (Signed)
PMT consult received. Reviewed chart with Dr. Romie MinusGene Freeman. Patient does not have chronic pain related to life-limiting or serious illness. Outside palliative medicine team scope of practice. Patient may benefit from outpatient pain management specialist. Notified Dr. Gonzella Lexhungel via text page.  NO CHARGE  Vennie HomansMegan Jacque Byron, FNP-C Palliative Medicine Team  Phone: [phone] Fax: [phone]

## 2017-10-04 NOTE — Progress Notes (Addendum)
[facility] for inpatient pain management.  Spoke with hospitalist Conni SlipperJames Saksi Adams.  Has been accepted to MedSurg bed.  Have several holding beds and other transfers outside hospital transfers and may delay the patient's transfer there. Patient informed.

## 2017-10-05 LAB — CREATININE, SERUM: CREATININE: 0.99 mg/dL (ref 0.61–1.24)

## 2017-10-05 MED ORDER — SENNOSIDES-DOCUSATE SODIUM 8.6-50 MG PO TABS
2.0000 | ORAL_TABLET | Freq: Every day | ORAL | Status: DC | PRN
  Administered 2017-10-05 – 2017-10-06 (×2): 2 via ORAL
  Filled 2017-10-05 (×2): qty 2

## 2017-10-05 MED ORDER — HYDROMORPHONE HCL 2 MG/ML IJ SOLN
1.0000 mg | INTRAMUSCULAR | Status: DC | PRN
  Administered 2017-10-05 – 2017-10-06 (×6): 1 mg via INTRAVENOUS
  Filled 2017-10-05 (×6): qty 1

## 2017-10-05 MED ORDER — TRAMADOL HCL 50 MG PO TABS
100.0000 mg | ORAL_TABLET | Freq: Three times a day (TID) | ORAL | Status: DC
  Administered 2017-10-05 – 2017-10-06 (×4): 100 mg via ORAL
  Filled 2017-10-05 (×4): qty 2

## 2017-10-05 MED ORDER — PREGABALIN 50 MG PO CAPS
100.0000 mg | ORAL_CAPSULE | Freq: Three times a day (TID) | ORAL | Status: DC
  Administered 2017-10-05 – 2017-10-07 (×7): 100 mg via ORAL
  Filled 2017-10-05 (×7): qty 2

## 2017-10-05 NOTE — Progress Notes (Signed)
PROGRESS NOTE                                                                                                                                                                                                             Patient Demographics:    Lance Carter, is a 32 y.o. male, DOB - May 25, 1986, ZOX:[id number]  Admit date - 09/28/2017   Admitting Physician Briscoe Deutscher, MD  Outpatient Primary MD for the patient is Daaleman, Nadyne Coombes, MD  LOS - 5  Outpatient Specialists: none  Chief Complaint  Patient presents with  . Testicle Pain       Brief Narrative   32 year old male with chronic low back pain on Percocet as needed as outpatient presented to the ED on 4/5 with worsening intractable left scrotal pain of unclear etiology.  Symptoms started about 1 month back when he was vacationing in [place].  He reports walking almost 4 miles every day, denies any injury to the site, fevers, chills, dysuria, penile discharge.  Reports having similar symptoms one year back while he was in [place] and resolved on its own.   After returning to the [place] from [place] E has been having frequent waxing and waning pain over the scrotum.  He has been seen multiple times in the ED, evaluated by urology as outpatient on 3/20 where ultrasound of the scrotum showed small bilateral hydrocele without acute findings.  Given persistent symptoms he was admitted for aggressive pain control and has received several rounds of IV ketamine, IV morphine, IV Dilaudid, Toradol, PCA pump with Dilaudid and fentanyl.  He is now on scheduled oral tramadol and as needed IV Dilaudid for last 2 days.  He is also continued on home dose Skelaxin and has been on Lyrica. He also had a left spermatic cord block on 4/7 without symptomatic improvement. Patient also had a scrotal ultrasound with Doppler, MRI of the lumbar spine without any acute findings.  Infectious workup including GC  probe, UA and HIV have been negative.   Subjective:   Says his pain started to get worse today.   Assessment  & Plan :    Principal Problem: Left scrotal allodynia Etiology unclear.  Workup including labs for an infectious process and imaging have been negative.  Could be opioid induced induced hyperalgesia (pain had worsened immediately after IV Dilaudid  and morphine use), allodynia with chronic doses of opiates for chronic back pain.  Cannot rule out neuropathic pain.  Patient does not have a history of illicit drug use or is narcotic seeking. Have been unable to wean off these pain medications and is requiring  experimentation with several different medications. At present he is now on scheduled tramadol 100 mg every 6 hours and IV Dilaudid 1 mg every 3 hours as needed (dose adjusted today).  Reports tramadol not helping him much so I reduce the dose to 100 mg every 8 hours.  Increase Lyrica dose as well.  Continue scheduled Neurontin (also started on 4/11) Continue current dose of Dilaudid, fentanyl patch pain (started on 4/11), Celebrex twice daily continue  home Skelaxin as needed for muscle spasms.  He received a course of doxycycline (5 days).  Clonidine tapered to discontinue.  MRI of the pelvis done yesterday showing 6 mm benign left testicular cyst and trace left pelvic ascites.  No other significant finding.  Called to [facility] for inpatient hospital transfer to be evaluated by inpatient pain management. Spoke with hospitalist Conni Slipper Adams.  Has been accepted to MedSurg bed.  Have several holding beds and other transfers outside hospital transfers and may delay the patient's transfer there.    Code Status : Full code  Family Communication  : Mother at bedside  Disposition Plan  : Requesting hospital transfer to [facility] Barriers For Discharge : Ongoing symptoms   Consults  : Urology  Procedures  : Ultrasound scrotum, MRI lumbar spine MRI pelvis , scrotal nerve block  DVT  Prophylaxis  :  Lovenox -   Lab Results  Component Value Date   PLT 288 09/28/2017    Antibiotics  :    Anti-infectives (From admission, onward)   Start     Dose/Rate Route Frequency Ordered Stop   09/28/17 2230  doxycycline (VIBRA-TABS) tablet 100 mg  Status:  Discontinued     100 mg Oral 2 times daily 09/28/17 1957 10/02/17 1341        Objective:   Vitals:   10/03/17 2153 10/04/17 0525 10/04/17 2148 10/05/17 0509  BP: (!) 131/100 127/83 (!) 157/97 124/80  Pulse: 86 83 99 61  Resp:   17 17  Temp: 98.6 F (37 C) 98.4 F (36.9 C) 98.7 F (37.1 C) 97.8 F (36.6 C)  TempSrc: Oral Oral Oral Oral  SpO2: 94% 93% 95% 94%  Weight:      Height:        Wt Readings from Last 3 Encounters:  09/28/17 102.1 kg (225 lb)  09/24/17 102.1 kg (225 lb)  11/27/16 99.8 kg (220 lb)    No intake or output data in the 24 hours ending 10/05/17 1418   Physical Exam General: Not in distress HEENT: Moist mucosa, supple neck Chest: Clear bilaterally CVs: Normal S1-S2, no murmurs GI: Soft, nondistended, nontender, no scrotal swelling but patient does not allow me to touch the scrotal area due to aggravated pain.  No inguinal lymphadenopathy Musculoskeletal: Warm, no edema     Data Review:    CBC Recent Labs  Lab 09/28/17 1703 09/28/17 1714  WBC 8.8  --   HGB 16.1 15.6  HCT 45.1 46.0  PLT 288  --   MCV 85.1  --   MCH 30.4  --   MCHC 35.7  --   RDW 11.9  --   LYMPHSABS 1.5  --   MONOABS 0.4  --   EOSABS 0.1  --  BASOSABS 0.0  --     Chemistries  Recent Labs  Lab 09/28/17 1714 10/02/17 0739 10/05/17 0756  NA 140 142  --   K 3.7 4.2  --   CL 104 106  --   CO2  --  29  --   GLUCOSE 107* 88  --   BUN 11 11  --   CREATININE 1.10 1.12 0.99  CALCIUM  --  8.9  --    ------------------------------------------------------------------------------------------------------------------ No results for input(s): CHOL, HDL, LDLCALC, TRIG, CHOLHDL, LDLDIRECT in the last 72  hours.  No results found for: HGBA1C ------------------------------------------------------------------------------------------------------------------ No results for input(s): TSH, T4TOTAL, T3FREE, THYROIDAB in the last 72 hours.  Invalid input(s): FREET3 ------------------------------------------------------------------------------------------------------------------ No results for input(s): VITAMINB12, FOLATE, FERRITIN, TIBC, IRON, RETICCTPCT in the last 72 hours.  Coagulation profile No results for input(s): INR, PROTIME in the last 168 hours.  No results for input(s): DDIMER in the last 72 hours.  Cardiac Enzymes No results for input(s): CKMB, TROPONINI, MYOGLOBIN in the last 168 hours.  Invalid input(s): CK ------------------------------------------------------------------------------------------------------------------ No results found for: BNP  Inpatient Medications  Scheduled Meds: . celecoxib  200 mg Oral BID  . enoxaparin (LOVENOX) injection  40 mg Subcutaneous Q24H  . famotidine  20 mg Oral BID  . fentaNYL  50 mcg Transdermal Q72H  . FLUoxetine  20 mg Oral Daily  . gabapentin  400 mg Oral TID  . pramipexole  0.25 mg Oral QHS  . pregabalin  100 mg Oral TID  . traMADol  100 mg Oral Q8H   Continuous Infusions: PRN Meds:.acetaminophen **OR** acetaminophen, baclofen, bisacodyl, diphenhydrAMINE, HYDROmorphone (DILAUDID) injection, metaxalone, ondansetron **OR** ondansetron (ZOFRAN) IV, senna-docusate  Micro Results Recent Results (from the past 240 hour(s))  Urine culture     Status: None   Collection Time: 09/29/17  9:50 AM  Result Value Ref Range Status   Specimen Description URINE, RANDOM  Final   Special Requests NONE  Final   Culture   Final    NO GROWTH Performed at [facility], [address] [place], [place] [address]    Report Status 09/30/2017 FINAL  Final    Radiology Reports Mr Lumbar Spine Wo Contrast  Result Date: 09/30/2017 CLINICAL  DATA:  32 y/o M; anterior scrotal pain which may refer to the L1/L2 lumbar plexus. Evaluation for radiculopathy. EXAM: MRI LUMBAR SPINE WITHOUT CONTRAST TECHNIQUE: Multiplanar, multisequence MR imaging of the lumbar spine was performed. No intravenous contrast was administered. COMPARISON:  09/27/2017 CT abdomen and pelvis. 06/02/2015 lumbar spine MRI. FINDINGS: Segmentation:  Standard. Alignment:  Physiologic. Vertebrae:  No fracture, evidence of discitis, or bone lesion. Conus medullaris and cauda equina: Conus extends to the L1 level. Conus and cauda equina appear normal. Paraspinal and other soft tissues: Postsurgical changes related to left L5-S1 laminectomy and discectomy with scar tissue in the laminectomy bed and left lateral epidural space. No fluid collection or edema. Disc levels: L1-2: No significant disc displacement, foraminal stenosis, or canal stenosis. L2-3: No significant disc displacement, foraminal stenosis, or canal stenosis. L3-4: No significant disc displacement, foraminal stenosis, or canal stenosis. L4-5: Minimal disc bulge. No significant foraminal or canal stenosis. L5-S1: Interval resorption of left subarticular disc extrusion. Persistent mild disc bulge eccentric to the left with endplate marginal osteophytes and mild facet hypertrophy. Mild-to-moderate left foraminal stenosis and mild narrowing of the left lateral recess. No appreciable nerve impingement. No right foraminal or canal stenosis. IMPRESSION: 1. No significant disc disease or findings of nerve impingement  at the L1-2 or L2-3 levels. 2. Interval resorption of L5-S1 left subarticular disc extrusion. Mild residual L5-S1 disc bulge eccentric to the left with endplate marginal osteophytes resulting in mild-to-moderate left foraminal stenosis. 3. No acute osseous abnormality or significant canal stenosis. Electronically Signed   By: Mitzi Hansen M.D.   On: 09/30/2017 05:06   Mr Pelvis W Wo Contrast  Result Date:  10/05/2017 CLINICAL DATA:  Left testicular pain x1 month EXAM: MRI PELVIS WITHOUT AND WITH CONTRAST TECHNIQUE: Multiplanar multisequence MR imaging of the pelvis was performed both before and after administration of intravenous contrast. CONTRAST:  20mL MULTIHANCE GADOBENATE DIMEGLUMINE 529 MG/ML IV SOLN COMPARISON:  Scrotal ultrasound dated 09/27/2017. CT abdomen/pelvis dated 09/27/2017. FINDINGS: Urinary Tract:  Bladder is within normal limits. Bowel:  Visualized bowel is unremarkable. Vascular/Lymphatic: No evidence of aneurysm. No pelvic lymphadenopathy. Reproductive:  Prostate is unremarkable. Right testis is within normal limits. 6 x 5 x 6 mm T2 hyperintense lesion in the inferior left testis (series 3/image 21), without enhancement following contrast administration (series 17/image 42), compatible benign testicular cyst. Other:  Trace left pelvic ascites (series 11/image 14). Musculoskeletal: No focal osseous lesions. IMPRESSION: 6 mm benign left testicular cyst. Trace left pelvic ascites. Electronically Signed   By: Charline Bills M.D.   On: 10/05/2017 10:21   Ct Abdomen Pelvis W Contrast  Result Date: 09/27/2017 CLINICAL DATA:  Testicular pain, left testicular pain for 1 month EXAM: CT ABDOMEN AND PELVIS WITH CONTRAST TECHNIQUE: Multidetector CT imaging of the abdomen and pelvis was performed using the standard protocol following bolus administration of intravenous contrast. CONTRAST:  ISOVUE-300 IOPAMIDOL (ISOVUE-300) INJECTION 61% COMPARISON:  None. FINDINGS: Lower chest: Bibasilar atelectasis. Hepatobiliary: No focal liver abnormality is seen. No gallstones, gallbladder wall thickening, or biliary dilatation. Pancreas: Unremarkable. No pancreatic ductal dilatation or surrounding inflammatory changes. Spleen: Normal in size without focal abnormality. Adrenals/Urinary Tract: Adrenal glands are unremarkable. Kidneys are normal, without renal calculi, focal lesion, or hydronephrosis. Bladder is  unremarkable. Stomach/Bowel: Stomach is within normal limits. Appendix appears normal. No evidence of bowel wall thickening, distention, or inflammatory changes. Diverticulosis of the sigmoid colon without evidence of diverticulitis. Vascular/Lymphatic: Aortic atherosclerosis. No enlarged abdominal or pelvic lymph nodes. Reproductive: Prostate is unremarkable. Other: No abdominal wall hernia or abnormality. No abdominopelvic ascites. Musculoskeletal: No acute or significant osseous findings. 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 IMPRESSION: 1. No acute abdominal or pelvic pathology. Electronically Signed   By: Elige Ko   On: 09/27/2017 17:44   US Scrotum W/doppler  Result Date: 09/27/2017 CLINICAL DATA:  Testicular pain. EXAM: SCROTAL ULTRASOUND DOPPLER ULTRASOUND OF THE TESTICLES TECHNIQUE: Complete ultrasound examination of the testicles, epididymis, and other scrotal structures was performed. Color and spectral Doppler ultrasound were also utilized to evaluate blood flow to the testicles. COMPARISON:  09/25/2017 FINDINGS: Right testicle Measurements: 4 x 2 x 2.5 cm.  No mass or microlithiasis visualized. Left testicle Measurements: 4.5 x 2 x 3.1 cm. 5.7 x 6.3 x 6.2 mm anechoic left testicular mass most consistent with a small cyst. No solid mass or microlithiasis visualized. Right epididymis:  Normal in size and appearance. Left epididymis:  Normal in size and appearance. Hydrocele:  Trace bilateral hydroceles. Varicocele:  Small left varicocele. Pulsed Doppler interrogation of both testes demonstrates normal low resistance arterial and venous waveforms bilaterally. IMPRESSION: 1. No testicular torsion. 2. Small left varicocele. Electronically Signed   By: Elige Ko   On: 09/27/2017 19:36  [place] Scrotum W/doppler  Result Date: 09/25/2017 CLINICAL DATA:  Left testicle pain EXAM: SCROTAL ULTRASOUND DOPPLER ULTRASOUND OF THE TESTICLES TECHNIQUE: Complete  ultrasound examination of the testicles, epididymis, and other scrotal structures was performed. Color and spectral Doppler ultrasound were also utilized to evaluate blood flow to the testicles. COMPARISON:  None. FINDINGS: Right testicle Measurements: 4.1 x 2.1 x 3 cm. No mass or microlithiasis visualized. Left testicle Measurements: 4.9 x 2.3 x 2.8 cm. Intratesticular cyst measuring 0.7 x 0.7 x 0.6 cm. Right epididymis:  Normal in size and appearance. Left epididymis:  Normal in size and appearance. Hydrocele:  Small bilateral hydroceles. Varicocele:  Small left varicocele. Pulsed Doppler interrogation of both testes demonstrates normal low resistance arterial and venous waveforms bilaterally. IMPRESSION: 1. Negative for testicular torsion 2. Small bilateral hydroceles.  Small left varicocele 3. 7 mm left intratesticular cyst Electronically Signed   By: Jasmine PangKim  Fujinaga M.D.   On: 09/25/2017 01:05    Time Spent in minutes  25   Jaxsen Bernhart M.D on 10/05/2017 at 2:18 PM  Between 7am to 7pm - Pager - [phone]  After 7pm go to [email or web address] - password [facility]  [facility] -  Office  [phone]

## 2017-10-06 DIAGNOSIS — R52 Pain, unspecified: Secondary | ICD-10-CM

## 2017-10-06 LAB — CBC
HCT: 41.9 % (ref 39.0–52.0)
Hemoglobin: 14.6 g/dL (ref 13.0–17.0)
MCH: 29.8 pg (ref 26.0–34.0)
MCHC: 34.8 g/dL (ref 30.0–36.0)
MCV: 85.5 fL (ref 78.0–100.0)
PLATELETS: 234 10*3/uL (ref 150–400)
RBC: 4.9 MIL/uL (ref 4.22–5.81)
RDW: 11.9 % (ref 11.5–15.5)
WBC: 6.4 10*3/uL (ref 4.0–10.5)

## 2017-10-06 LAB — BASIC METABOLIC PANEL
Anion gap: 9 (ref 5–15)
BUN: 11 mg/dL (ref 6–20)
CO2: 27 mmol/L (ref 22–32)
Calcium: 8.9 mg/dL (ref 8.9–10.3)
Chloride: 101 mmol/L (ref 101–111)
Creatinine, Ser: 0.98 mg/dL (ref 0.61–1.24)
GFR calc Af Amer: >60 mL/min (ref >60)
GLUCOSE: 105 mg/dL — AB (ref 65–99)
POTASSIUM: 3.7 mmol/L (ref 3.5–5.1)
Sodium: 137 mmol/L (ref 135–145)

## 2017-10-06 MED ORDER — HYDROMORPHONE HCL 2 MG/ML IJ SOLN
1.0000 mg | Freq: Four times a day (QID) | INTRAMUSCULAR | Status: DC | PRN
  Administered 2017-10-06: 1 mg via INTRAVENOUS
  Filled 2017-10-06: qty 1

## 2017-10-06 MED ORDER — HYDRALAZINE HCL 20 MG/ML IJ SOLN: 10.0000 mg | Freq: Four times a day (QID) | INTRAMUSCULAR | Status: DC | PRN

## 2017-10-06 MED ORDER — HYDROMORPHONE HCL 2 MG PO TABS
2.0000 mg | ORAL_TABLET | Freq: Four times a day (QID) | ORAL | Status: DC | PRN
  Administered 2017-10-07: 2 mg via ORAL
  Filled 2017-10-06 (×2): qty 1

## 2017-10-06 MED ORDER — AMLODIPINE BESYLATE 10 MG PO TABS
10.0000 mg | ORAL_TABLET | Freq: Every day | ORAL | Status: DC
  Administered 2017-10-06 – 2017-10-07 (×2): 10 mg via ORAL
  Filled 2017-10-06 (×2): qty 1

## 2017-10-06 MED ORDER — HYDROMORPHONE HCL 2 MG/ML IJ SOLN
0.5000 mg | INTRAMUSCULAR | Status: AC | PRN
  Administered 2017-10-06 – 2017-10-07 (×2): 0.5 mg via INTRAVENOUS
  Filled 2017-10-06 (×2): qty 1

## 2017-10-06 MED ORDER — HYDROMORPHONE HCL 2 MG PO TABS
1.0000 mg | ORAL_TABLET | ORAL | Status: DC | PRN
  Administered 2017-10-06: 1 mg via ORAL
  Filled 2017-10-06: qty 1

## 2017-10-06 MED ORDER — HYDROMORPHONE HCL 2 MG PO TABS
2.0000 mg | ORAL_TABLET | ORAL | Status: AC
  Administered 2017-10-06: 2 mg via ORAL

## 2017-10-06 MED ORDER — TRAMADOL HCL 50 MG PO TABS
50.0000 mg | ORAL_TABLET | Freq: Three times a day (TID) | ORAL | Status: DC
  Administered 2017-10-06 – 2017-10-07 (×2): 50 mg via ORAL
  Filled 2017-10-06 (×2): qty 1

## 2017-10-06 MED ORDER — HYDROMORPHONE HCL 1 MG/ML PO LIQD
1.0000 mg | ORAL | Status: DC | PRN
  Administered 2017-10-06: 1 mg via ORAL
  Filled 2017-10-06 (×2): qty 1

## 2017-10-06 MED ORDER — HYDROMORPHONE HCL 1 MG/ML PO LIQD: 1.0000 mg | ORAL | Status: DC | PRN

## 2017-10-06 NOTE — Progress Notes (Signed)
Pt asking for po pain meds earlier than ordered,says he wants it to get into his system before pain gets worse. MD text paged to either call nurse or pt's room and talk to patient

## 2017-10-06 NOTE — Progress Notes (Signed)
Pt reports a pain level of 5 or 6 . Does not appear to be in distress. Added that he is not hurting much but wants to see if pain medicine will work when he gets home

## 2017-10-06 NOTE — Progress Notes (Signed)
Pt observed on his phone while appearing to be comfortably resting in bed. No s/s of pain and distressed noted at this time. Monitoring will continue

## 2017-10-06 NOTE — Progress Notes (Signed)
Pt and his family have multiple requests to speak to the MD for several changes to med orders. MD paged and he responded each time and addressed pt and his family's concerns. Pt currently pacing around the room not happy about most recent changes

## 2017-10-06 NOTE — Progress Notes (Signed)
Pt still c/o severe pain,says the liquid medication did not help much. MD text paged

## 2017-10-06 NOTE — Progress Notes (Signed)
   10/06/17 1300  Vitals  BP (!) 137/96  BP Location Right Arm  BP Method Automatic  Patient Position (if appropriate) Lying  Pulse Rate 83  Resp (!) 22  Oxygen Therapy  SpO2 97 %  O2 Device Room Air  vs as above. MD gave verbal order for 1mg  iv dilaudid q6hrs for breakthrough pain

## 2017-10-06 NOTE — Progress Notes (Addendum)
PROGRESS NOTE                                                                                                                                                                                                             Patient Demographics:    Lance Carter, is a 32 y.o. male, DOB - 29-Nov-1985, ZOX:[id number]  Admit date - 09/28/2017   Admitting Physician Briscoe Deutscher, MD  Outpatient Primary MD for the patient is Daaleman, Nadyne Coombes, MD  LOS - 6  Outpatient Specialists: none  Chief Complaint  Patient presents with  . Testicle Pain       Brief Narrative   32 year old male with chronic low back pain on Percocet as needed as outpatient presented to the ED on 4/5 with worsening intractable left scrotal pain of unclear etiology.  Symptoms started about 1 month back when he was vacationing in [place].  He reports walking almost 4 miles every day, denies any injury to the site, fevers, chills, dysuria, penile discharge.  Reports having similar symptoms one year back while he was in [place] and resolved on its own.   After returning to the [place] from [place] E has been having frequent waxing and waning pain over the scrotum.  He has been seen multiple times in the ED, evaluated by urology as outpatient on 3/20 where ultrasound of the scrotum showed small bilateral hydrocele without acute findings.  Given persistent symptoms he was admitted for aggressive pain control and has received several rounds of IV ketamine, IV morphine, IV Dilaudid, Toradol, PCA pump with Dilaudid and fentanyl.  He is now on scheduled oral tramadol and as needed IV Dilaudid for last 2 days.  He is also continued on home dose Skelaxin and has been on Lyrica. He also had a left spermatic cord block on 4/7 without symptomatic improvement. Patient also had a scrotal ultrasound with Doppler, MRI of the lumbar spine without any acute findings.  Infectious workup including GC  probe, UA and HIV have been negative.   Subjective:   Patient in bed, appears comfortable and in no distress, denies any headache, no fever, no chest pain or pressure, no shortness of breath , no abdominal pain. No focal weakness.  Left-sided scrotal pain has improved but still wants to continue his multiple pain medications.   Assessment  & Plan :  Principal Problem:  Left scrotal allodynia - Etiology unclear.  Workup including labs for an infectious process and imaging which were MRI L. spine check for referred pain along with scrotal ultrasound have been negative.    Could be opioid induced induced hyperalgesia (pain had initially worsened immediately after IV Dilaudid and morphine use), allodynia with chronic doses of opiates for chronic back pain.  Cannot rule out neuropathic pain.  Patient does not have a known history of illicit drug use or known history of narcotic abuse.  Of note he has been seen by urologist Dr. Mena Goes who told the patient that he had no reason for scrotal pain, he also had a scrotal nerve block this admission which did not relieve the pain suggesting that scrotal pain is less likely.  His UA was negative, he was on empiric doxycycline for 5 days, currently on multiple pain medications and appears to be in no distress, he still appears to be somewhat obsessed with his narcotic regimen, I observe the patient 3 times over a period of 30 minutes from outside the room where he was in no distress and texting on his cell phone or watching television.  Case DW DR Mena Goes Urologist, agrees that we should taper Narcotics, he will see the patient tomorrow, likely DC tomorrow, patient informed (became dramatic afater that).  However was called by patient's mother in the evening and said they had fired Dr. Mena Goes and would not like to see a new urologist here, they already have an appointment with a urologist at [facility] and they would like to pursue that route.  He had requested  the previous physician that he needs to be transferred to [facility] for further evaluation, previous physician had called [facility] hospitalist Dr. Cher Nakai and he has been accepted to MedSurg but pending bed availability.  However clinically he does not be in any distress whatsoever, have discussed his case with urology today as well.  Have tried transitioning him to oral narcotics in hopes of possible discharge tomorrow.    HTN - placed on Norvasc and as needed hydralazine.  Continue to monitor and adjust.   Addendum - was paged over 10 times by the RN upon patient and mother's request for various pain medicine changes, talked with mother around 5.30 pm, she requested a few changes which were made including stopping IV dilaudid and prepare for early AM discharge, but called soon after that son wants another IV dose of Dilaudid. Multiple observations by RN have not found the patient to be in any major distress at all.    Code Status : Full code  Family Communication  : Mother over the phone in the evening, she wanted him to be discharged first thing 10/07/2017.  Disposition Plan  : Requesting hospital transfer to [facility]  Barriers For Discharge : Ongoing symptoms  Consults  : Urology  Procedures  :   Ultrasound scrotum, MRI lumbar spine MRI pelvis , scrotal nerve block  DVT Prophylaxis  :  Lovenox   Lab Results  Component Value Date   PLT 234 10/06/2017    Antibiotics  :    Anti-infectives (From admission, onward)   Start     Dose/Rate Route Frequency Ordered Stop   09/28/17 2230  doxycycline (VIBRA-TABS) tablet 100 mg  Status:  Discontinued     100 mg Oral 2 times daily 09/28/17 1957 10/02/17 1341        Objective:   Vitals:   10/05/17 1433 10/05/17 2154 10/06/17 0544  10/06/17 0956  BP: (!) 136/98 (!) 144/90 (!) 136/99 (!) 147/101  Pulse: 82 83 69 74  Resp:  18  16  Temp: 98.3 F (36.8 C) 98.8 F (37.1 C) 97.7 F (36.5 C) 98.5 F (36.9 C)  TempSrc: Oral Oral Oral Oral    SpO2: 97% 96% 94% 96%  Weight:      Height:        Wt Readings from Last 3 Encounters:  09/28/17 102.1 kg (225 lb)  09/24/17 102.1 kg (225 lb)  11/27/16 99.8 kg (220 lb)     Intake/Output Summary (Last 24 hours) at 10/06/2017 1135 Last data filed at 10/06/2017 0957 Gross per 24 hour  Intake 693 ml  Output -  Net 693 ml     Physical Exam  Awake Alert, Oriented X 3, No new F.N deficits, Normal affect, No distress at allo [place].AT,PERRAL Supple Neck,No JVD, No cervical lymphadenopathy appriciated.  Symmetrical Chest wall movement, Good air movement bilaterally, CTAB RRR,No Gallops, Rubs or new Murmurs, No Parasternal Heave +ve B.Sounds, Abd Soft, No tenderness, No organomegaly appriciated, No rebound - guarding or rigidity. No Cyanosis, Clubbing or edema, No new Rash or bruise, refused scrotal exam as it hurts     Data Review:    CBC Recent Labs  Lab 10/06/17 0411  WBC 6.4  HGB 14.6  HCT 41.9  PLT 234  MCV 85.5  MCH 29.8  MCHC 34.8  RDW 11.9    Chemistries  Recent Labs  Lab 10/02/17 0739 10/05/17 0756 10/06/17 0411  NA 142  --  137  K 4.2  --  3.7  CL 106  --  101  CO2 29  --  27  GLUCOSE 88  --  105*  BUN 11  --  11  CREATININE 1.12 0.99 0.98  CALCIUM 8.9  --  8.9   ------------------------------------------------------------------------------------------------------------------ No results for input(s): CHOL, HDL, LDLCALC, TRIG, CHOLHDL, LDLDIRECT in the last 72 hours.  No results found for: HGBA1C ------------------------------------------------------------------------------------------------------------------ No results for input(s): TSH, T4TOTAL, T3FREE, THYROIDAB in the last 72 hours.  Invalid input(s): FREET3 ------------------------------------------------------------------------------------------------------------------ No results for input(s): VITAMINB12, FOLATE, FERRITIN, TIBC, IRON, RETICCTPCT in the last 72 hours.  Coagulation  profile No results for input(s): INR, PROTIME in the last 168 hours.  No results for input(s): DDIMER in the last 72 hours.  Cardiac Enzymes No results for input(s): CKMB, TROPONINI, MYOGLOBIN in the last 168 hours.  Invalid input(s): CK ------------------------------------------------------------------------------------------------------------------ No results found for: BNP  Inpatient Medications  Scheduled Meds: . amLODipine  10 mg Oral Daily  . celecoxib  200 mg Oral BID  . enoxaparin (LOVENOX) injection  40 mg Subcutaneous Q24H  . famotidine  20 mg Oral BID  . fentaNYL  50 mcg Transdermal Q72H  . FLUoxetine  20 mg Oral Daily  . gabapentin  400 mg Oral TID  . pramipexole  0.25 mg Oral QHS  . pregabalin  100 mg Oral TID  . traMADol  100 mg Oral Q8H   Continuous Infusions: PRN Meds:.acetaminophen **OR** acetaminophen, baclofen, bisacodyl, diphenhydrAMINE, hydrALAZINE, HYDROmorphone HCl, metaxalone, ondansetron **OR** ondansetron (ZOFRAN) IV, senna-docusate  Micro Results Recent Results (from the past 240 hour(s))  Urine culture     Status: None   Collection Time: 09/29/17  9:50 AM  Result Value Ref Range Status   Specimen Description URINE, RANDOM  Final   Special Requests NONE  Final   Culture   Final    NO GROWTH Performed at [facility], [address] [place], [place] [address]    Report Status 09/30/2017 FINAL  Final    Radiology Reports Mr Lumbar Spine Wo Contrast  Result Date: 09/30/2017 CLINICAL DATA:  32 y/o M; anterior scrotal pain which may refer to the L1/L2 lumbar plexus. Evaluation for radiculopathy. EXAM: MRI LUMBAR SPINE WITHOUT CONTRAST TECHNIQUE: Multiplanar, multisequence MR imaging of the lumbar spine was performed. No intravenous contrast was administered. COMPARISON:  09/27/2017 CT abdomen and pelvis. 06/02/2015 lumbar spine MRI. FINDINGS: Segmentation:  Standard. Alignment:  Physiologic. Vertebrae:  No fracture, evidence of discitis, or  bone lesion. Conus medullaris and cauda equina: Conus extends to the L1 level. Conus and cauda equina appear normal. Paraspinal and other soft tissues: Postsurgical changes related to left L5-S1 laminectomy and discectomy with scar tissue in the laminectomy bed and left lateral epidural space. No fluid collection or edema. Disc levels: L1-2: No significant disc displacement, foraminal stenosis, or canal stenosis. L2-3: No significant disc displacement, foraminal stenosis, or canal stenosis. L3-4: No significant disc displacement, foraminal stenosis, or canal stenosis. L4-5: Minimal disc bulge. No significant foraminal or canal stenosis. L5-S1: Interval resorption of left subarticular disc extrusion. Persistent mild disc bulge eccentric to the left with endplate marginal osteophytes and mild facet hypertrophy. Mild-to-moderate left foraminal stenosis and mild narrowing of the left lateral recess. No appreciable nerve impingement. No right foraminal or canal stenosis. IMPRESSION: 1. No significant disc disease or findings of nerve impingement at the L1-2 or L2-3 levels. 2. Interval resorption of L5-S1 left subarticular disc extrusion. Mild residual L5-S1 disc bulge eccentric to the left with endplate marginal osteophytes resulting in mild-to-moderate left foraminal stenosis. 3. No acute osseous abnormality or significant canal stenosis. Electronically Signed   By: Mitzi Hansen M.D.   On: 09/30/2017 05:06   Mr Pelvis W Wo Contrast  Result Date: 10/05/2017 CLINICAL DATA:  Left testicular pain x1 month EXAM: MRI PELVIS WITHOUT AND WITH CONTRAST TECHNIQUE: Multiplanar multisequence MR imaging of the pelvis was performed both before and after administration of intravenous contrast. CONTRAST:  20mL MULTIHANCE GADOBENATE DIMEGLUMINE 529 MG/ML IV SOLN COMPARISON:  Scrotal ultrasound dated 09/27/2017. CT abdomen/pelvis dated 09/27/2017. FINDINGS: Urinary Tract:  Bladder is within normal limits. Bowel:   Visualized bowel is unremarkable. Vascular/Lymphatic: No evidence of aneurysm. No pelvic lymphadenopathy. Reproductive:  Prostate is unremarkable. Right testis is within normal limits. 6 x 5 x 6 mm T2 hyperintense lesion in the inferior left testis (series 3/image 21), without enhancement following contrast administration (series 17/image 42), compatible benign testicular cyst. Other:  Trace left pelvic ascites (series 11/image 14). Musculoskeletal: No focal osseous lesions. IMPRESSION: 6 mm benign left testicular cyst. Trace left pelvic ascites. Electronically Signed   By: Charline Bills M.D.   On: 10/05/2017 10:21   Ct Abdomen Pelvis W Contrast  Result Date: 09/27/2017 CLINICAL DATA:  Testicular pain, left testicular pain for 1 month EXAM: CT ABDOMEN AND PELVIS WITH CONTRAST TECHNIQUE: Multidetector CT imaging of the abdomen and pelvis was performed using the standard protocol following bolus administration of intravenous contrast. CONTRAST:  ISOVUE-300 IOPAMIDOL (ISOVUE-300) INJECTION 61% COMPARISON:  None. FINDINGS: Lower chest: Bibasilar atelectasis. Hepatobiliary: No focal liver abnormality is seen. No gallstones, gallbladder wall thickening, or biliary dilatation. Pancreas: Unremarkable. No pancreatic ductal dilatation or surrounding inflammatory changes. Spleen: Normal in size without focal abnormality. Adrenals/Urinary Tract: Adrenal glands are unremarkable. Kidneys are normal, without renal calculi, focal lesion, or hydronephrosis. Bladder is unremarkable. Stomach/Bowel: Stomach is within normal limits. Appendix appears normal. No evidence of  bowel wall thickening, distention, or inflammatory changes. Diverticulosis of the sigmoid colon without evidence of diverticulitis. Vascular/Lymphatic: Aortic atherosclerosis. No enlarged abdominal or pelvic lymph nodes. Reproductive: Prostate is unremarkable. Other: No abdominal wall hernia or abnormality. No abdominopelvic ascites. Musculoskeletal: No  acute or significant osseous findings. 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 IMPRESSION: 1. No acute abdominal or pelvic pathology. Electronically Signed   By: Elige KoHetal  Patel   On: 09/27/2017 17:44   [place] Scrotum W/doppler  Result Date: 09/27/2017 CLINICAL DATA:  Testicular pain. EXAM: SCROTAL ULTRASOUND DOPPLER ULTRASOUND OF THE TESTICLES TECHNIQUE: Complete ultrasound examination of the testicles, epididymis, and other scrotal structures was performed. Color and spectral Doppler ultrasound were also utilized to evaluate blood flow to the testicles. COMPARISON:  09/25/2017 FINDINGS: Right testicle Measurements: 4 x 2 x 2.5 cm.  No mass or microlithiasis visualized. Left testicle Measurements: 4.5 x 2 x 3.1 cm. 5.7 x 6.3 x 6.2 mm anechoic left testicular mass most consistent with a small cyst. No solid mass or microlithiasis visualized. Right epididymis:  Normal in size and appearance. Left epididymis:  Normal in size and appearance. Hydrocele:  Trace bilateral hydroceles. Varicocele:  Small left varicocele. Pulsed Doppler interrogation of both testes demonstrates normal low resistance arterial and venous waveforms bilaterally. IMPRESSION: 1. No testicular torsion. 2. Small left varicocele. Electronically Signed   By: Elige KoHetal  Patel   On: 09/27/2017 19:36   [place] Scrotum W/doppler  Result Date: 09/25/2017 CLINICAL DATA:  Left testicle pain EXAM: SCROTAL ULTRASOUND DOPPLER ULTRASOUND OF THE TESTICLES TECHNIQUE: Complete ultrasound examination of the testicles, epididymis, and other scrotal structures was performed. Color and spectral Doppler ultrasound were also utilized to evaluate blood flow to the testicles. COMPARISON:  None. FINDINGS: Right testicle Measurements: 4.1 x 2.1 x 3 cm. No mass or microlithiasis visualized. Left testicle Measurements: 4.9 x 2.3 x 2.8 cm. Intratesticular cyst measuring 0.7 x 0.7 x 0.6 cm. Right epididymis:  Normal in size and appearance.  Left epididymis:  Normal in size and appearance. Hydrocele:  Small bilateral hydroceles. Varicocele:  Small left varicocele. Pulsed Doppler interrogation of both testes demonstrates normal low resistance arterial and venous waveforms bilaterally. IMPRESSION: 1. Negative for testicular torsion 2. Small bilateral hydroceles.  Small left varicocele 3. 7 mm left intratesticular cyst Electronically Signed   By: Jasmine PangKim  Fujinaga M.D.   On: 09/25/2017 01:05    Time Spent in minutes  25  Signature  Susa RaringPrashant Nova Evett M.D on 10/06/2017 at 11:35 AM  Between 7am to 7pm - Pager - [phone] ( page via [email or web address], text pages only, please mention full 10 digit call back number).  After 7pm go to [email or web address] - password [email or web address]

## 2017-10-06 NOTE — Progress Notes (Signed)
Pt now asking for iv pain medicine for break through pain. MD paged

## 2017-10-06 NOTE — Progress Notes (Signed)
Pt observed to be in distress and pain,scores pain at 8/10. Medicated with liquid dilaudid as ordered

## 2017-10-06 NOTE — Progress Notes (Signed)
Pt medicated with iv dilaudid at 1319 hrs as ordered. Went back to check on pt at 1400 hrs and pt observed to be comfortable in comparison to previous state. When asked for pain rating, pt responded that pain was still there despite appearing more calmer and not moving around as previously. Scheduled tramadol administered

## 2017-10-06 NOTE — Progress Notes (Signed)
One time dose of 2mg  po dilaudid administered as ordered by MD for breakthrough pain

## 2017-10-06 NOTE — Progress Notes (Signed)
Patient requesting for pain meds but appears to be comfortable. Rate pain at 6 but his mother suggested to go ahead and get pain meds so it won't get worst. Also suggested to patient to keep him KVO so his IV won't get infiltrated, but dislikes suggestion. IV team consulted for IV reinsertion.

## 2017-10-07 MED ORDER — AMLODIPINE BESYLATE 10 MG PO TABS: 10.0000 mg | ORAL_TABLET | Freq: Every day | ORAL | 0 refills | Status: DC

## 2017-10-07 MED ORDER — FENTANYL 50 MCG/HR TD PT72: 50.0000 ug | MEDICATED_PATCH | TRANSDERMAL | 0 refills | Status: AC

## 2017-10-07 MED ORDER — HYDROMORPHONE HCL 2 MG PO TABS: 2.0000 mg | ORAL_TABLET | Freq: Three times a day (TID) | ORAL | 0 refills | Status: DC | PRN

## 2017-10-07 MED ORDER — ONDANSETRON HCL 4 MG PO TABS: 4.0000 mg | ORAL_TABLET | Freq: Three times a day (TID) | ORAL | 0 refills | Status: DC | PRN

## 2017-10-07 MED ORDER — CELECOXIB 200 MG PO CAPS: 200.0000 mg | ORAL_CAPSULE | Freq: Two times a day (BID) | ORAL | 0 refills | Status: DC

## 2017-10-07 MED ORDER — BISACODYL 5 MG PO TBEC: 10.0000 mg | DELAYED_RELEASE_TABLET | Freq: Two times a day (BID) | ORAL | 0 refills | Status: DC | PRN

## 2017-10-07 MED ORDER — PANTOPRAZOLE SODIUM 40 MG PO TBEC: 40.0000 mg | DELAYED_RELEASE_TABLET | Freq: Every day | ORAL | 0 refills | Status: DC

## 2017-10-07 MED ORDER — PREGABALIN 100 MG PO CAPS: 100.0000 mg | ORAL_CAPSULE | Freq: Three times a day (TID) | ORAL | 0 refills | Status: DC

## 2017-10-07 NOTE — Discharge Instructions (Signed)
Follow with Primary MD Daaleman, Nadyne Coombesimothy P, MD and [facility]rolog[facility] in 3-4 days   Get CBC, BMP,  checked  by Primary MD  in 3-4 days    Activity: As tolerated with Full fall precautions use walker/cane & assistance as needed  Disposition Home    Diet:  Heart Healthy     Special Instructions: If you have smoked or chewed Tobacco  in the last 2 yrs please stop smoking, stop any regular Alcohol  and or any Recreational drug use.  On your next visit with your primary care physician please Get Medicines reviewed and adjusted.  Please request your Prim.MD to go over all Hospital Tests and Procedure/Radiological results at the follow up, please get all Hospital records sent to your Prim MD by signing hospital release before you go home.  If you experience worsening of your admission symptoms, develop shortness of breath, life threatening emergency, suicidal or homicidal thoughts you must seek medical attention immediately by calling 911 or calling your MD immediately  if symptoms less severe.  You Must read complete instructions/literature along with all the possible adverse reactions/side effects for all the Medicines you take and that have been prescribed to you. Take any new Medicines after you have completely understood and accpet all the possible adverse reactions/side effects.   Do not drive, operate heavy machinery, perform activities at heights, swimming or participation in water activities or provide baby sitting services if your were admitted for syncope or siezures until you have seen by Primary MD or a Neurologist and advised to do so again.  Do not drive when taking Pain medications.    Do not take more than prescribed Pain, Sleep and Anxiety Medications  Wear Seat belts while driving.   Please note  You were cared for by a hospitalist during your hospital stay. If you have any questions about your discharge medications or the care you received while you were in the  hospital after you are discharged, you can call the unit and asked to speak with the hospitalist on call if the hospitalist that took care of you is not available. Once you are discharged, your primary care physician will handle any further medical issues. Please note that NO REFILLS for any discharge medications will be authorized once you are discharged, as it is imperative that you return to your primary care physician (or establish a relationship with a primary care physician if you do not have one) for your aftercare needs so that they can reassess your need for medications and monitor your lab values.

## 2017-10-07 NOTE — Progress Notes (Signed)
Pt c/o 8/10 left scrotal pain that is ongoing. Requested that NP night coverage be paged for IV pain medication for break through pain management. Pt appears to be resting, legs are restless. NP Blount made aware, new order received. Will continue to monitor.

## 2017-10-07 NOTE — Progress Notes (Signed)
Mr. Lorelle FormosaHanna seen this morning at patient's request that he receives a second opinion regarding his care. Chart, labs, vitals, medications reviewed and I discussed with the patient at bedside and with his mother over the speaker phone. Patient sleeping comfortably when I entered the room but wakes up easily and is telling me that he has severe left testicular pain for about a month. He is aware that all imaging/workup has been essentially negative but is upset that his pain is not well managed. He states that the main thing that worked was IV dilaudid, and now that it has been discontinued he is in pain.   I explained to the patient that it is not clear where the pain is coming from given all negative workup, also with a left spermatic cord block on 4/7 without improvement may suggest a higher neuropathic etiology. I also explained that use of IV pain medications is limited to post op patients or patients that are unable to take good oral intake. I am in agreement with Dr. Thedore MinsSingh that the most reasonable course of action is transition to oral pain regimen and [facility] (has an appointment in ~8 days). Of note, patient has been refusing to be seen again by our urologist.   I don't see an emergent need [facility] at this point.    Costin M. Elvera LennoxGherghe, MD, PhD [facility]  If 7PM-7AM, please contact night-coverage [email or web address] Password [email or web address]

## 2017-10-07 NOTE — Discharge Summary (Addendum)
Lance Carter ZOX:[id number] DOB: 27-Jan-1986 DOA: 09/28/2017  PCP: Cathlean Marseilles, MD  Admit date: 09/28/2017  Discharge date: 10/07/2017  Admitted From: Home  Disposition:  Home   Recommendations for Outpatient Follow-up:   Follow up with PCP in 1-2 weeks  PCP Please obtain BMP/CBC, 2 view CXR in 1week,  (see Discharge instructions)   PCP Please follow up on the following pending results: None   Home Health: None   Equipment/Devices: None  Consultations: Urology Discharge Condition: Stable   CODE STATUS: Full   Diet Recommendation:  Heart Healthy    Chief Complaint  Patient presents with  . Testicle Pain     Brief history of present illness from the day of admission and additional interim summary    32 year old male with chronic low back pain on Percocet as needed as outpatient presented to the ED on 4/5 with worsening intractable left scrotal pain of unclear etiology.  Symptoms started about 1 month back when he was vacationing in [place].  He reports walking almost 4 miles every day, denies any injury to the site, fevers, chills, dysuria, penile discharge.  Reports having similar symptoms one year back while he was in [place] and resolved on its own.    After returning to the [place] from [place] E has been having frequent waxing and waning pain over the scrotum.  He has been seen multiple times in the ED, evaluated by urology as outpatient on 3/20 where ultrasound of the scrotum showed small bilateral hydrocele without acute findings.  Given persistent symptoms he was admitted for aggressive pain control and has received several rounds of IV ketamine, IV morphine, IV Dilaudid, Toradol, PCA pump with Dilaudid and fentanyl.  He is now on scheduled oral tramadol and as needed IV Dilaudid for last 2 days.  He is also  continued on home dose Skelaxin and has been on Lyrica.  He also had a left spermatic cord block on 4/7 without symptomatic improvement. Patient also had a scrotal ultrasound with Doppler, MRI of the lumbar spine without any acute findings.  Infectious workup including GC probe, UA and HIV have been negative.                                                                   Hospital Course    Left scrotal allodynia - Etiology unclear.  Workup including labs for an infectious process and imaging which were MRI L. spine check for referred pain along with scrotal ultrasound have been negative.    He was told that his pain could be opioid induced induced hyperalgesia (pain had initially worsened immediately after IV Dilaudid and morphine use), but then later improved with high doses of IV Dilaudid which he persistently demanded.    Having said that patient does not have  a known history of illicit drug use or known history of narcotic abuse.    Patient was seen a few days prior to this admission by Dr. Retta Dionesahlstedt urologist in the office, patient says he was not impressed by the urologist and does not want to see him again, he was then seen here by another urologist Dr. Mena GoesEskridge who to definitely rule out a scrotal cause of pain did a scrotal nerve block without much relief, patient has now fired Dr. Mena GoesEskridge and does not want to see him either.  His entire workup which included MRI of the L-spine to rule out referred pain as a cause was nonacute, scrotal ultrasound was nonacute, scrotal nerve block was not effective in a way ruling out scrotal cause of pain per Dr. Mena GoesEskridge.  Earlier his UA was unremarkable, GC chlamydia were negative, he had received 5 days of doxycycline.  Patient has been here for the last 1 week, he has been repeatedly demanding IV Dilaudid with name and specifies the dose he wants, at took over his care on 10/06/2017 when he was on a regimen of fentanyl patch, Celebrex, Neurontin,  Lyrica, Skelaxin, IV Dilaudid, Toradol and Tylenol as needed.  I had observed the patient for over 45 minutes on 10/06/2017 from outside the room he was not seem to be in any distress whatsoever and he had in fact called the nurse to get a dose of pain medication while I was there, I had requested the nurse on 10/06/2017 to also observe the patient to see if there are any signs of acute distress, he was observed for close to 8-10 hours without any such observation, kindly see nursing notes.  I had seen the patient on the morning of 10/06/2017 when we discussed that in order to discharge him I had to transition him to oral Dilaudid which he agreed also told him that since he has excellent oral intake PO pain meds are clinically warranted rather than IV he agreed, however within 1 hour of my leaving I was paged by the nurse that patient wants IV Dilaudid to be on board for breakthrough which I added, I was then called in the evening by patient's mother to discontinue IV pain medicine as she wanted her son to be discharged on 10/07/2017, few minutes after that I was recalled that patient wants his IV Dilaudid back.  Note in between I had another 7-8 more calls in regards to pain regimen.  I also discussed the case with Dr. Mena GoesEskridge to see if any further workup needs to be done or is there any explanation for this intensity of acute pain, I was told by the urologist that he does not think of any further workup that needs to be done or if this was anything that would explain this intensity of pain.  He offered to see the patient again, I informed the patient and his mother of the same but they said they had fired Dr. Mena GoesEskridge and [facility].  This morning patient again appears stable, he repeatedly refuses scrotal exam, his vital signs are stable, he and his mother wish him to be discharged home, they want fentanyl patch and oral Dilaudid given for a few days until they see the PCP, I have  counseled him against overuse, given him 3-5-day supply of both.  Instructed him not to drive while he is taking Dilaudid and cautioned him against accidental overdose.  Mother and mother's boyfriend present. Also yesterday I was called  by the RN that patient wants another clinical opinion after which the Medical Director on call Dr Elvera Lennox also independently evaluated the patient earlier today.  I would request a PCP to have another opinion with a Urologist of choice, patient also seems to have some chronic pain and narcotic requirement issues for the last 3 years, will defer long-term use of narcotics to PCP and his primary urologist of choice.  Unfortunately I failed to see any reason to explain for his intense scrotal pain, he refused scrotal exams x2, he fired 2 different urologists, third urology opinion will not harm and he may benefit from outpatient neurology workup as well to rule out any other etiology.     HTN -I have placed him on Norvasc PCP to monitor and adjust.   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 in 3-4 days   Get CBC, BMP,  checked  by Primary MD  in 3-4 days    Activity: As tolerated with Full fall precautions use walker/cane & assistance as needed  Disposition Home    Diet:  Heart Healthy     Special Instructions: If you have smoked or chewed Tobacco  in the last 2 yrs please stop smoking, stop any regular Alcohol  and or any Recreational drug use.  On your next visit with your primary care physician please Get Medicines reviewed and adjusted.  Please request your Prim.MD  to go over all Hospital Tests and Procedure/Radiological results at the follow up, please get all Hospital records sent to your Prim MD by signing hospital release before you go home.  If you experience worsening of your admission symptoms, develop shortness of breath, life threatening emergency, suicidal or homicidal thoughts you must seek medical attention immediately by calling 911 or calling your MD immediately  if symptoms less severe.  You Must read complete instructions/literature along with all the possible adverse reactions/side effects for all the Medicines you take and that have been prescribed to you. Take any new Medicines after you have completely understood and accpet all the possible adverse reactions/side effects.   Do not drive, operate heavy machinery, perform activities at heights, swimming or participation in water activities or provide baby sitting services if your were admitted for syncope or siezures until you have seen by Primary MD or a Neurologist and advised to do so again.  Do not drive when taking Pain medications.    Do not take more than prescribed Pain, Sleep and Anxiety Medications  Wear Seat belts while driving.   Please note  You were cared for by a hospitalist during your hospital stay. If you have any questions about your discharge medications or the care you received while you were in the hospital after you are discharged, you can call the unit and asked to speak with the hospitalist on call if the hospitalist that took care of you is not available. Once you are discharged, your primary care physician will handle any further medical issues. Please note that NO REFILLS for any discharge medications will  be authorized once you are discharged, as it is imperative that you return to your primary care physician (or establish a relationship with a primary care physician if you do not have one) for your aftercare needs so that they can reassess your need for  medications and monitor your lab values.   Increase activity slowly   Complete by:  As directed       Discharge Medications   Allergies as of 10/07/2017      Reactions   Sulfa Antibiotics Hives      Medication List    STOP taking these medications   doxycycline 100 MG capsule Commonly known as:  VIBRAMYCIN   gabapentin 300 MG capsule Commonly known as:  NEURONTIN   ibuprofen 200 MG tablet Commonly known as:  ADVIL,MOTRIN   metaxalone 800 MG tablet Commonly known as:  SKELAXIN   naproxen sodium 220 MG tablet Commonly known as:  ALEVE   oxyCODONE-acetaminophen 5-325 MG tablet Commonly known as:  PERCOCET/ROXICET     TAKE these medications   acetaminophen 325 MG tablet Commonly known as:  TYLENOL Take 650 mg by mouth every 6 (six) hours as needed for mild pain.   amLODipine 10 MG tablet Commonly known as:  NORVASC Take 1 tablet (10 mg total) by mouth daily.   baclofen 10 MG tablet Commonly known as:  LIORESAL Take 10 mg by mouth daily as needed for muscle spasms.   bisacodyl 5 MG EC tablet Commonly known as:  DULCOLAX Take 2 tablets (10 mg total) by mouth 2 (two) times daily as needed for moderate constipation.   celecoxib 200 MG capsule Commonly known as:  CELEBREX Take 1 capsule (200 mg total) by mouth 2 (two) times daily.   diclofenac sodium 1 % Gel Commonly known as:  VOLTAREN Apply 1 application topically daily as needed. For pain   fentaNYL 50 MCG/HR Commonly known as:  DURAGESIC - dosed mcg/hr Place 1 patch (50 mcg total) onto the skin every 3 (three) days for 9 days.   FLUoxetine 20 MG capsule Commonly known as:  PROZAC Take 20 mg by mouth daily.   fluticasone 50 MCG/ACT nasal spray Commonly known as:  FLONASE Place 1 spray into both nostrils 4 (four) times daily as needed for allergies or rhinitis.   HYDROmorphone 2 MG tablet Commonly known as:  DILAUDID Take 1 tablet (2 mg total) by mouth every 8 (eight) hours as needed for severe  pain.   ondansetron 4 MG tablet Commonly known as:  ZOFRAN Take 1 tablet (4 mg total) by mouth every 8 (eight) hours as needed for nausea or vomiting.   pantoprazole 40 MG tablet Commonly known as:  PROTONIX Take 1 tablet (40 mg total) by mouth daily.   pramipexole 0.25 MG tablet Commonly known as:  MIRAPEX Take 0.25 mg by mouth at bedtime as needed (restless legs).   pregabalin 100 MG capsule Commonly known as:  LYRICA Take 1 capsule (100 mg total) by mouth 3 (three) times daily. If this medication is not approved by her insurance go back to your previous dose of Neurontin.       Follow-up Information    Daaleman, Nadyne Coombes, MD. Schedule an appointment as soon as possible for a visit in 3 day(s).   Specialty:  Family Medicine Contact information: [id number] [facility] [address] [phone]           Major procedures and Radiology Reports - PLEASE review detailed and  final reports thoroughly  -     Ultrasound scrotum, MRI lumbar spine MRI pelvis ,   Scrotal nerve block   Mr Lumbar Spine Wo Contrast  Result Date: 09/30/2017 CLINICAL DATA:  32 y/o M; anterior scrotal pain which may refer to the L1/L2 lumbar plexus. Evaluation for radiculopathy. EXAM: MRI LUMBAR SPINE WITHOUT CONTRAST TECHNIQUE: Multiplanar, multisequence MR imaging of the lumbar spine was performed. No intravenous contrast was administered. COMPARISON:  09/27/2017 CT abdomen and pelvis. 06/02/2015 lumbar spine MRI. FINDINGS: Segmentation:  Standard. Alignment:  Physiologic. Vertebrae:  No fracture, evidence of discitis, or bone lesion. Conus medullaris and cauda equina: Conus extends to the L1 level. Conus and cauda equina appear normal. Paraspinal and other soft tissues: Postsurgical changes related to left L5-S1 laminectomy and discectomy with scar tissue in the laminectomy bed and left lateral epidural space. No fluid collection or edema. Disc levels: L1-2: No  significant disc displacement, foraminal stenosis, or canal stenosis. L2-3: No significant disc displacement, foraminal stenosis, or canal stenosis. L3-4: No significant disc displacement, foraminal stenosis, or canal stenosis. L4-5: Minimal disc bulge. No significant foraminal or canal stenosis. L5-S1: Interval resorption of left subarticular disc extrusion. Persistent mild disc bulge eccentric to the left with endplate marginal osteophytes and mild facet hypertrophy. Mild-to-moderate left foraminal stenosis and mild narrowing of the left lateral recess. No appreciable nerve impingement. No right foraminal or canal stenosis. IMPRESSION: 1. No significant disc disease or findings of nerve impingement at the L1-2 or L2-3 levels. 2. Interval resorption of L5-S1 left subarticular disc extrusion. Mild residual L5-S1 disc bulge eccentric to the left with endplate marginal osteophytes resulting in mild-to-moderate left foraminal stenosis. 3. No acute osseous abnormality or significant canal stenosis. Electronically Signed   By: Mitzi Hansen M.D.   On: 09/30/2017 05:06   Mr Pelvis W Wo Contrast  Result Date: 10/05/2017 CLINICAL DATA:  Left testicular pain x1 month EXAM: MRI PELVIS WITHOUT AND WITH CONTRAST TECHNIQUE: Multiplanar multisequence MR imaging of the pelvis was performed both before and after administration of intravenous contrast. CONTRAST:  20mL MULTIHANCE GADOBENATE DIMEGLUMINE 529 MG/ML IV SOLN COMPARISON:  Scrotal ultrasound dated 09/27/2017. CT abdomen/pelvis dated 09/27/2017. FINDINGS: Urinary Tract:  Bladder is within normal limits. Bowel:  Visualized bowel is unremarkable. Vascular/Lymphatic: No evidence of aneurysm. No pelvic lymphadenopathy. Reproductive:  Prostate is unremarkable. Right testis is within normal limits. 6 x 5 x 6 mm T2 hyperintense lesion in the inferior left testis (series 3/image 21), without enhancement following contrast administration (series 17/image 42),  compatible benign testicular cyst. Other:  Trace left pelvic ascites (series 11/image 14). Musculoskeletal: No focal osseous lesions. IMPRESSION: 6 mm benign left testicular cyst. Trace left pelvic ascites. Electronically Signed   By: Charline Bills M.D.   On: 10/05/2017 10:21   Ct Abdomen Pelvis W Contrast  Result Date: 09/27/2017 CLINICAL DATA:  Testicular pain, left testicular pain for 1 month EXAM: CT ABDOMEN AND PELVIS WITH CONTRAST TECHNIQUE: Multidetector CT imaging of the abdomen and pelvis was performed using the standard protocol following bolus administration of intravenous contrast. CONTRAST:  ISOVUE-300 IOPAMIDOL (ISOVUE-300) INJECTION 61% COMPARISON:  None. FINDINGS: Lower chest: Bibasilar atelectasis. Hepatobiliary: No focal liver abnormality is seen. No gallstones, gallbladder wall thickening, or biliary dilatation. Pancreas: Unremarkable. No pancreatic ductal dilatation or surrounding inflammatory changes. Spleen: Normal in size without focal abnormality. Adrenals/Urinary Tract: Adrenal glands are unremarkable. Kidneys are normal, without renal calculi, focal lesion, or hydronephrosis. Bladder is unremarkable. Stomach/Bowel: Stomach is within normal limits. Appendix appears  normal. No evidence of bowel wall thickening, distention, or inflammatory changes. Diverticulosis of the sigmoid colon without evidence of diverticulitis. Vascular/Lymphatic: Aortic atherosclerosis. No enlarged abdominal or pelvic lymph nodes. Reproductive: Prostate is unremarkable. Other: No abdominal wall hernia or abnormality. No abdominopelvic ascites. Musculoskeletal: No acute or significant osseous findings. 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 IMPRESSION: 1. No acute abdominal or pelvic pathology. Electronically Signed   By: Elige Ko   On: 09/27/2017 17:44   US Scrotum W/doppler  Result Date: 09/27/2017 CLINICAL DATA:  Testicular pain. EXAM: SCROTAL  ULTRASOUND DOPPLER ULTRASOUND OF THE TESTICLES TECHNIQUE: Complete ultrasound examination of the testicles, epididymis, and other scrotal structures was performed. Color and spectral Doppler ultrasound were also utilized to evaluate blood flow to the testicles. COMPARISON:  09/25/2017 FINDINGS: Right testicle Measurements: 4 x 2 x 2.5 cm.  No mass or microlithiasis visualized. Left testicle Measurements: 4.5 x 2 x 3.1 cm. 5.7 x 6.3 x 6.2 mm anechoic left testicular mass most consistent with a small cyst. No solid mass or microlithiasis visualized. Right epididymis:  Normal in size and appearance. Left epididymis:  Normal in size and appearance. Hydrocele:  Trace bilateral hydroceles. Varicocele:  Small left varicocele. Pulsed Doppler interrogation of both testes demonstrates normal low resistance arterial and venous waveforms bilaterally. IMPRESSION: 1. No testicular torsion. 2. Small left varicocele. Electronically Signed   By: Elige Ko   On: 09/27/2017 19:36   US Scrotum W/doppler  Result Date: 09/25/2017 CLINICAL DATA:  Left testicle pain EXAM: SCROTAL ULTRASOUND DOPPLER ULTRASOUND OF THE TESTICLES TECHNIQUE: Complete ultrasound examination of the testicles, epididymis, and other scrotal structures was performed. Color and spectral Doppler ultrasound were also utilized to evaluate blood flow to the testicles. COMPARISON:  None. FINDINGS: Right testicle Measurements: 4.1 x 2.1 x 3 cm. No mass or microlithiasis visualized. Left testicle Measurements: 4.9 x 2.3 x 2.8 cm. Intratesticular cyst measuring 0.7 x 0.7 x 0.6 cm. Right epididymis:  Normal in size and appearance. Left epididymis:  Normal in size and appearance. Hydrocele:  Small bilateral hydroceles. Varicocele:  Small left varicocele. Pulsed Doppler interrogation of both testes demonstrates normal low resistance arterial and venous waveforms bilaterally. IMPRESSION: 1. Negative for testicular torsion 2. Small bilateral hydroceles.  Small left  varicocele 3. 7 mm left intratesticular cyst Electronically Signed   By: Jasmine Pang M.D.   On: 09/25/2017 01:05    Micro Results     Recent Results (from the past 240 hour(s))  Urine culture     Status: None   Collection Time: 09/29/17  9:50 AM  Result Value Ref Range Status   Specimen Description URINE, RANDOM  Final   Special Requests NONE  Final   Culture   Final    NO GROWTH Performed at [facility], [address] [place], [place] [address]    Report Status 09/30/2017 FINAL  Final    Today   Subjective    Cecile Guevara today has no headache,no chest abdominal pain,no new weakness tingling or numbness, he wants to go home today.    Objective   Blood pressure 113/86, pulse 87, temperature 98.4 F (36.9 C), temperature source Oral, resp. rate 18, height 5\' 10"  (1.778 m), weight 102.1 kg (225 lb), SpO2 98 %.   Intake/Output Summary (Last 24 hours) at 10/07/2017 1006 Last data filed at 10/07/2017 0658 Gross per 24 hour  Intake 340 ml  Output -  Net 340 ml    Exam  Awake Alert, dramatic affect, no focal deficits  [place].AT,PERRAL Supple Neck,No JVD, No cervical lymphadenopathy appriciated.  Symmetrical Chest wall movement, Good air movement bilaterally, CTAB RRR,No Gallops,Rubs or new Murmurs, No Parasternal Heave +ve B.Sounds, Abd Soft, Non tender, No organomegaly appriciated, No rebound -guarding or rigidity. No Cyanosis, Clubbing or edema, No new Rash or bruise, refused scrotal exam   Data Review   CBC w Diff:  Lab Results  Component Value Date   WBC 6.4 10/06/2017   HGB 14.6 10/06/2017   HCT 41.9 10/06/2017   PLT 234 10/06/2017   LYMPHOPCT 17 09/28/2017   MONOPCT 5 09/28/2017   EOSPCT 1 09/28/2017   BASOPCT 0 09/28/2017    CMP:  Lab Results  Component Value Date   NA 137 10/06/2017   K 3.7 10/06/2017   CL 101 10/06/2017   CO2 27 10/06/2017   BUN 11 10/06/2017   CREATININE 0.98 10/06/2017   PROT 6.8 09/27/2017   ALBUMIN 4.6 09/27/2017    BILITOT 0.8 09/27/2017   ALKPHOS 57 09/27/2017   AST 28 09/27/2017   ALT 42 09/27/2017  .   Total Time in preparing paper work, data evaluation and todays exam - 35 minutes  Susa Raring M.D on 10/07/2017 at 10:06 AM  [facility]   Office  [phone]

## 2017-12-18 ENCOUNTER — Encounter (HOSPITAL_BASED_OUTPATIENT_CLINIC_OR_DEPARTMENT_OTHER): Payer: Self-pay | Admitting: Emergency Medicine

## 2017-12-18 ENCOUNTER — Emergency Department (HOSPITAL_BASED_OUTPATIENT_CLINIC_OR_DEPARTMENT_OTHER)
Admission: EM | Admit: 2017-12-18 | Discharge: 2017-12-18 | Disposition: A | Discharge status: Home / Self Care | Attending: Emergency Medicine | Admitting: Emergency Medicine

## 2017-12-18 ENCOUNTER — Other Ambulatory Visit: Payer: Self-pay

## 2017-12-18 DIAGNOSIS — N50819 Testicular pain, unspecified: Secondary | ICD-10-CM

## 2017-12-18 DIAGNOSIS — N509 Disorder of male genital organs, unspecified: Secondary | ICD-10-CM | POA: Diagnosis not present

## 2017-12-18 DIAGNOSIS — Z79899 Other long term (current) drug therapy: Secondary | ICD-10-CM | POA: Insufficient documentation

## 2017-12-18 DIAGNOSIS — N50812 Left testicular pain: Secondary | ICD-10-CM | POA: Diagnosis present

## 2017-12-18 MED ORDER — HYDROMORPHONE HCL 1 MG/ML IJ SOLN
2.0000 mg | Freq: Once | INTRAMUSCULAR | Status: AC
  Administered 2017-12-18: 2 mg via INTRAMUSCULAR
  Filled 2017-12-18: qty 2

## 2017-12-18 NOTE — ED Notes (Signed)
Dr. Campos at the bedside.  

## 2017-12-18 NOTE — Discharge Instructions (Addendum)
Call the U[facility] team at [facility] this morning for follow up  Take your pain medications as prescribed

## 2017-12-18 NOTE — ED Provider Notes (Signed)
[facility] EMERGENCY DEPARTMENT Provider Note   CSN: [id number] Arrival date & time: 12/18/17  0407     History   Chief Complaint Chief Complaint  Patient presents with  . Testicle Pain    HPI Lance Carter is a 32 y.o. male.  HPI Patient is a 32 year old male with 4 to 5 months of ongoing left-sided testicular and scrotal pain.  He has had an extensive work-up to this point including consultations and evaluations [place]rology in [place] as well as urology at [facility] [place].  He is underwent several testicular ultrasounds as well as CT abdomen pelvis through the scrotum as well as MRI lumbar spine none of which have found a etiology for his testicular pain.  He underwent a spermatic cord block by Dr. Mena GoesEskridge on September 30, 2017 which did not relieve the pain which concerned Dr. Mena GoesEskridge that this pain was non-urologic in nature.  He is being managed with chronic opioid medications through pain clinic at this time with intermittent relief of his symptoms.  He was seen in the emergency department at [facility] [place] just hours prior to visitation here.  At that time he underwent testicular and scrotal ultrasound which demonstrated a known benign-appearing left testicular cyst with no other abnormalities found.  He was seen by consultation by the urology team at [facility] [place] [facility] this evening and no further intervention or procedures were performed at that time and they had told the patient that they would call him for follow-up in the clinic.  Patient reports ongoing severe pain at this time and feels like he has limited options and that no one is listening to him.   Past Medical History:  Diagnosis Date  . Chronic lower back pain 2015   with left lumbar radiculopathy  . History of chicken pox   . S/P discectomy for herniated nucleus pulposus 04/2014    Patient Active Problem List   Diagnosis Date Noted  . 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 Allodynia 10/01/2017  .  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 Intractable pain 09/28/2017  . Chronic lower back pain   . S/P discectomy for herniated nucleus pulposus 04/26/2014    Past Surgical History:  Procedure Laterality Date  . INGUINAL HERNIA REPAIR     as child  . 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 Authorizing Provider  acetaminophen (TYLENOL) 325 MG tablet Take 650 mg by mouth every 6 (six) hours as needed for mild pain.    [provider]  amLODipine (NORVASC) 10 MG tablet Take 1 tablet (10 mg total) by mouth daily. 10/07/17   Leroy SeaSingh, Prashant K, MD  baclofen (LIORESAL) 10 MG tablet Take 10 mg by mouth daily as needed for muscle spasms.    [provider]  bisacodyl (DULCOLAX) 5 MG EC tablet Take 2 tablets (10 mg total) by mouth 2 (two) times daily as needed for moderate constipation. 10/07/17   Leroy SeaSingh, Prashant K, MD  celecoxib (CELEBREX) 200 MG capsule Take 1 capsule (200 mg total) by mouth 2 (two) times daily. 10/07/17   Leroy SeaSingh, Prashant K, MD  diclofenac sodium (VOLTAREN) 1 % GEL Apply 1 application topically daily as needed. For pain 12/20/16 12/20/17  [provider]  FLUoxetine (PROZAC) 20 MG capsule Take 20 mg by mouth daily. 09/25/17   [provider]  fluticasone (FLONASE) 50 MCG/ACT nasal spray Place 1 spray into both nostrils 4 (four) times daily as needed for allergies or rhinitis.    [provider]  HYDROmorphone (DILAUDID) 2 MG tablet Take 1 tablet (2 mg total) by mouth every 8 (eight) hours as needed for severe pain. 10/07/17   Leroy Sea, MD  ondansetron (ZOFRAN) 4 MG tablet Take 1 tablet (4 mg total) by mouth every 8 (eight) hours as needed for nausea or vomiting. 10/07/17   Leroy Sea, MD  pantoprazole (PROTONIX) 40 MG tablet Take 1 tablet (40 mg total) by mouth daily. 10/07/17   Leroy Sea, MD    pramipexole (MIRAPEX) 0.25 MG tablet Take 0.25 mg by mouth at bedtime as needed (restless legs).     [provider]  pregabalin (LYRICA) 100 MG capsule Take 1 capsule (100 mg total) by mouth 3 (three) times daily. If this medication is not approved by her insurance go back to your previous dose of Neurontin. 10/07/17   Leroy Sea, MD    Family History Family History  Problem Relation Age of Onset  . Alcohol abuse Maternal Grandmother        cirrhosis, rest of maternal side  . Diabetes Paternal Grandfather     Social History Social History   Tobacco Use  . Smoking status: Never Smoker  . Smokeless tobacco: Never Used  Substance Use Topics  . Alcohol use: Yes    Alcohol/week: 0.0 oz    Comment: rarely  . Drug use: No     Allergies   Sulfa antibiotics   Review of Systems Review of Systems  All other systems reviewed and are negative.    Physical Exam Updated Vital Signs BP (!) 153/97 (BP Location: Left Arm)   Pulse 82   Temp 98.2 F (36.8 C) (Oral)   Resp 20   Ht 5\' 10"  (1.778 m)   Wt 93.4 kg (206 lb)   SpO2 100%   BMI 29.56 kg/m   Physical Exam  Constitutional: He is oriented to person, place, and time. He appears well-developed and well-nourished.  HENT:  Head: Normocephalic.  Eyes: EOM are normal.  Neck: Normal range of motion.  Pulmonary/Chest: Effort normal.  Genitourinary:  Genitourinary Comments: Deferred given recent evaluation at the outside emergency department and evaluation by urology consultation several hours ago  Musculoskeletal: Normal range of motion.  Neurological: He is alert and oriented to person, place, and time.  Skin: Skin is warm.  Psychiatric:  Tearful  Nursing note and vitals reviewed.    ED Treatments / Results  Labs (all labs ordered are listed, but only abnormal results are displayed) Labs Reviewed - No data to display  EKG None  Radiology No results found.  Procedures Procedures (including  critical care time)  Medications Ordered in ED Medications  HYDROmorphone (DILAUDID) injection 2 mg (2 mg Intramuscular Given 12/18/17 0442)     Initial Impression / Assessment and Plan / ED Course  I have reviewed the triage vital signs and the nursing notes.  Pertinent labs & imaging results that were available during my care of the patient were reviewed by me and considered in my medical decision making (see chart for details).     Given his ongoing scrotal and testicular pain with his recent evaluations by an outside emergency department and a urologist I deferred a GU examination in the ER for patient comfort  The majority of the time I spent with the patient  and the patient's spouse was listening to them and trying to help them to understand and navigate the complex medical work-up when it comes to chronic pain and uncertainty as to the cause of the chronic pain  I do not think he needs work-up here in the emergency department.  He does not need emergent urology consultation  He does not need repeat ultrasound at this time  I do not think he will benefit from inpatient hospitalization at this time given his relationship with the urology department at [facility] who has seen him in consultation within the past several hours.  My recommendation to him and his spouse is to contact the urology office at [facility] first thing this morning for additional recommendations from his specialist.  IM Dilaudid given for pain control    Final Clinical Impressions(s) / ED Diagnoses   Final diagnoses:  Persistent testicular pain    ED Discharge Orders    None       Azalia Bilis, MD 12/18/17 [phone]

## 2017-12-18 NOTE — ED Triage Notes (Signed)
Pt c/o 10/10 testicle pain for the past 3 months, been seen multiple times, [facility] and found out that he has a cyst on his testicle and sent home, pt here to have this cyst drain.

## 2017-12-19 ENCOUNTER — Other Ambulatory Visit: Payer: Self-pay

## 2017-12-19 ENCOUNTER — Emergency Department
Admission: EM | Admit: 2017-12-19 | Discharge: 2017-12-19 | Disposition: A | Discharge status: Home / Self Care | Attending: Emergency Medicine | Admitting: Emergency Medicine

## 2017-12-19 DIAGNOSIS — N50812 Left testicular pain: Secondary | ICD-10-CM | POA: Insufficient documentation

## 2017-12-19 DIAGNOSIS — Z79899 Other long term (current) drug therapy: Secondary | ICD-10-CM | POA: Diagnosis not present

## 2017-12-19 DIAGNOSIS — N50819 Testicular pain, unspecified: Secondary | ICD-10-CM | POA: Diagnosis present

## 2017-12-19 DIAGNOSIS — N509 Disorder of male genital organs, unspecified: Secondary | ICD-10-CM

## 2017-12-19 LAB — CBC WITH DIFFERENTIAL/PLATELET
BASOS ABS: 0 10*3/uL (ref 0–0.1)
BASOS PCT: 0 %
EOS ABS: 0 10*3/uL (ref 0–0.7)
Eosinophils Relative: 0 %
HCT: 46.5 % (ref 40.0–52.0)
HEMOGLOBIN: 16.7 g/dL (ref 13.0–18.0)
Lymphocytes Relative: 24 %
Lymphs Abs: 1.8 10*3/uL (ref 1.0–3.6)
MCH: 30.4 pg (ref 26.0–34.0)
MCHC: 36 g/dL (ref 32.0–36.0)
MCV: 84.5 fL (ref 80.0–100.0)
MONO ABS: 0.4 10*3/uL (ref 0.2–1.0)
MONOS PCT: 6 %
NEUTROS ABS: 5.2 10*3/uL (ref 1.4–6.5)
NEUTROS PCT: 70 %
Platelets: 338 10*3/uL (ref 150–440)
RBC: 5.51 MIL/uL (ref 4.40–5.90)
RDW: 13.3 % (ref 11.5–14.5)
WBC: 7.4 10*3/uL (ref 3.8–10.6)

## 2017-12-19 LAB — COMPREHENSIVE METABOLIC PANEL
ALK PHOS: 64 U/L (ref 38–126)
ALT: 41 U/L (ref 0–44)
ANION GAP: 11 (ref 5–15)
AST: 68 U/L — ABNORMAL HIGH (ref 15–41)
Albumin: 5.2 g/dL — ABNORMAL HIGH (ref 3.5–5.0)
BILIRUBIN TOTAL: 1 mg/dL (ref 0.3–1.2)
BUN: 6 mg/dL (ref 6–20)
CALCIUM: 10.2 mg/dL (ref 8.9–10.3)
CO2: 22 mmol/L (ref 22–32)
CREATININE: 1 mg/dL (ref 0.61–1.24)
Chloride: 108 mmol/L (ref 98–111)
GFR calc non Af Amer: >60 mL/min (ref >60)
Glucose, Bld: 102 mg/dL — ABNORMAL HIGH (ref 70–99)
Potassium: 3.3 mmol/L — ABNORMAL LOW (ref 3.5–5.1)
SODIUM: 141 mmol/L (ref 135–145)
Total Protein: 8.2 g/dL — ABNORMAL HIGH (ref 6.5–8.1)

## 2017-12-19 LAB — LIPASE, BLOOD: Lipase: 70 U/L — ABNORMAL HIGH (ref 11–51)

## 2017-12-19 MED ORDER — ONDANSETRON 4 MG PO TBDP
4.0000 mg | ORAL_TABLET | Freq: Once | ORAL | Status: AC | PRN
  Administered 2017-12-19: 4 mg via ORAL

## 2017-12-19 MED ORDER — HYDROMORPHONE HCL 1 MG/ML IJ SOLN
2.0000 mg | Freq: Once | INTRAMUSCULAR | Status: AC
Start: 2017-12-19 — End: 2017-12-19
  Administered 2017-12-19: 2 mg via INTRAMUSCULAR
  Filled 2017-12-19: qty 2

## 2017-12-19 MED ORDER — ONDANSETRON 4 MG PO TBDP
4.0000 mg | ORAL_TABLET | Freq: Once | ORAL | Status: AC
  Administered 2017-12-19: 4 mg via ORAL
  Filled 2017-12-19: qty 1

## 2017-12-19 MED ORDER — KETOROLAC TROMETHAMINE 60 MG/2ML IM SOLN
60.0000 mg | Freq: Once | INTRAMUSCULAR | Status: AC
  Administered 2017-12-19: 60 mg via INTRAMUSCULAR
  Filled 2017-12-19: qty 2

## 2017-12-19 MED ORDER — ONDANSETRON 4 MG PO TBDP
ORAL_TABLET | ORAL | Status: AC
  Filled 2017-12-19: qty 1

## 2017-12-19 NOTE — ED Triage Notes (Signed)
Pt states L sided testicular pain x 3 months. States 6 ER visits with not really an explanation. States had US done and told cyst but not able to do anything about it. States pain medicine at home but states hasn't been taking it d/t vomiting. Denies swelling. Denies discharge. Denies urinary symptoms. Denies injury occurring 3 months ago.

## 2017-12-19 NOTE — ED Provider Notes (Signed)
[facility] Emergency Department Provider Note  ____________________________________________  Time seen: Approximately 8:41 PM  I have reviewed the triage vital signs and the nursing notes.   HISTORY  Chief Complaint Testicle Pain   HPI Lance Carter is a 32 y.o. male with a history of chronic testicular pain who presents for evaluation of testicular pain.  Patient has had 4 to 5 months of left-sided testicular pain.  Has been evaluated in several different emergency departments and urology offices.  Underwent several testicular ultrasounds with the last one being 2 days ago, CT abdomen pelvis, and MRI with no clear etiology of his symptoms.  Most recent ultrasound done 2 days ago showed a stable left-sided cyst which according to patient is exactly the location of his pain.  He had requested that urology drain the cyst at [facility] however they sent him home.  He does not have an appointment with them for outpatient management for another 4 weeks.  He reports that he has Dilaudid at home however has been unable to keep anything down to the severity of the pain which causes him to vomit his pain medications.  He has tried Zofran at home also with no relief.  Patient is complaining of 10 out of 10 sharp pain located in the anterior part of his left testicle which has been there for several months.  The pain is unchanged from what he has been in the past.  There is no swelling, penile discharge, redness, abdominal pain.  Past Medical History:  Diagnosis Date  . Chronic lower back pain 2015   with left lumbar radiculopathy  . History of chicken pox   . S/P discectomy for herniated nucleus pulposus 04/2014    Patient Active Problem List   Diagnosis Date Noted  . 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 Allodynia 10/01/2017  . 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 Intractable pain 09/28/2017  . Chronic lower back pain   . S/P discectomy for herniated nucleus pulposus  04/26/2014    Past Surgical History:  Procedure Laterality Date  . INGUINAL HERNIA REPAIR     as child  . 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 Authorizing Provider  acetaminophen (TYLENOL) 325 MG tablet Take 650 mg by mouth every 6 (six) hours as needed for mild pain.    [provider]  amLODipine (NORVASC) 10 MG tablet Take 1 tablet (10 mg total) by mouth daily. 10/07/17   Leroy Sea, MD  baclofen (LIORESAL) 10 MG tablet Take 10 mg by mouth daily as needed for muscle spasms.    [provider]  bisacodyl (DULCOLAX) 5 MG EC tablet Take 2 tablets (10 mg total) by mouth 2 (two) times daily as needed for moderate constipation. 10/07/17   Leroy Sea, MD  celecoxib (CELEBREX) 200 MG capsule Take 1 capsule (200 mg total) by mouth 2 (two) times daily. 10/07/17   Leroy Sea, MD  diclofenac sodium (VOLTAREN) 1 % GEL Apply 1 application topically daily as needed. For pain 12/20/16 12/20/17  [provider]  FLUoxetine (PROZAC) 20 MG capsule Take 20 mg by mouth daily. 09/25/17   [provider]  fluticasone (FLONASE) 50 MCG/ACT nasal spray Place 1 spray into both nostrils 4 (four) times daily as needed for allergies or rhinitis.    [provider]  HYDROmorphone (DILAUDID) 2 MG tablet Take 1  tablet (2 mg total) by mouth every 8 (eight) hours as needed for severe pain. 10/07/17   Leroy Sea, MD  ondansetron (ZOFRAN) 4 MG tablet Take 1 tablet (4 mg total) by mouth every 8 (eight) hours as needed for nausea or vomiting. 10/07/17   Leroy Sea, MD  pantoprazole (PROTONIX) 40 MG tablet Take 1 tablet (40 mg total) by mouth daily. 10/07/17   Leroy Sea, MD  pramipexole (MIRAPEX) 0.25 MG tablet Take 0.25 mg by mouth at bedtime as needed (restless legs).     [provider]  pregabalin (LYRICA) 100 MG capsule Take 1 capsule (100  mg total) by mouth 3 (three) times daily. If this medication is not approved by her insurance go back to your previous dose of Neurontin. 10/07/17   Leroy Sea, MD    Allergies Sulfa antibiotics  Family History  Problem Relation Age of Onset  . Alcohol abuse Maternal Grandmother        cirrhosis, rest of maternal side  . Diabetes Paternal Grandfather     Social History Social History   Tobacco Use  . Smoking status: Never Smoker  . Smokeless tobacco: Never Used  Substance Use Topics  . Alcohol use: Yes    Alcohol/week: 0.0 oz    Comment: rarely  . Drug use: No    Review of Systems  Constitutional: Negative for fever. Eyes: Negative for visual changes. ENT: Negative for sore throat. Neck: No neck pain  Cardiovascular: Negative for chest pain. Respiratory: Negative for shortness of breath. Gastrointestinal: Negative for abdominal pain, vomiting or diarrhea. Genitourinary: Negative for dysuria. + L testicular pain Musculoskeletal: Negative for back pain. Skin: Negative for rash. Neurological: Negative for headaches, weakness or numbness. Psych: No SI or HI  ____________________________________________   PHYSICAL EXAM:  VITAL SIGNS: ED Triage Vitals  Enc Vitals Group     BP 12/19/17 1740 123/85     Pulse Rate 12/19/17 1740 96     Resp 12/19/17 1740 18     Temp 12/19/17 1740 98.1 F (36.7 C)     Temp Source 12/19/17 1740 Oral     SpO2 12/19/17 1740 99 %     Weight 12/19/17 1741 205 lb (93 kg)     Height 12/19/17 1741 5\' 10"  (1.778 m)     Head Circumference --      Peak Flow --      Pain Score 12/19/17 1741 9     Pain Loc --      Pain Edu? --      Excl. in GC? --     Constitutional: Alert and oriented.  HEENT:      Head: Normocephalic and atraumatic.         Eyes: Conjunctivae are normal. Sclera is non-icteric.       Mouth/Throat: Mucous membranes are moist.       Neck: Supple with no signs of meningismus. Cardiovascular: Regular rate and  rhythm. No murmurs, gallops, or rubs. 2+ symmetrical distal pulses are present in all extremities. No JVD. Respiratory: Normal respiratory effort. Lungs are clear to auscultation bilaterally. No wheezes, crackles, or rhonchi.  Gastrointestinal: Soft, non tender, and non distended with positive bowel sounds. No rebound or guarding. Genitourinary: No CVA tenderness. Bilateral testicles are descended, patient is tender to palpation on one specific location on the anterior aspect of the L testicle where a small cyst is palpable, bilateral positive cremasteric reflexes are present, no swelling or erythema of the scrotum. No  evidence of inguinal hernia. Musculoskeletal: Nontender with normal range of motion in all extremities. No edema, cyanosis, or erythema of extremities. Neurologic: Normal speech and language. Face is symmetric. Moving all extremities. No gross focal neurologic deficits are appreciated. Skin: Skin is warm, dry and intact. No rash noted. Psychiatric: Mood and affect are normal. Speech and behavior are normal.  ____________________________________________   LABS (all labs ordered are listed, but only abnormal results are displayed)  Labs Reviewed  COMPREHENSIVE METABOLIC PANEL - Abnormal; Notable for the following components:      Result Value   Potassium 3.3 (*)    Glucose, Bld 102 (*)    Total Protein 8.2 (*)    Albumin 5.2 (*)    AST 68 (*)    All other components within normal limits  LIPASE, BLOOD - Abnormal; Notable for the following components:   Lipase 70 (*)    All other components within normal limits  CBC WITH DIFFERENTIAL/PLATELET  URINALYSIS, COMPLETE (UACMP) WITH MICROSCOPIC   ____________________________________________  EKG  none  ____________________________________________  RADIOLOGY  none  ____________________________________________   PROCEDURES  Procedure(s) performed: None Procedures Critical Care performed:   None ____________________________________________   INITIAL IMPRESSION / ASSESSMENT AND PLAN / ED COURSE  32 y.o. male with a history of chronic testicular pain who presents for evaluation of testicular pain.  Patient has been evaluated at several different emergency departments with CT scan, ultrasounds, urology consults with no clear etiology of his pain.  He has Dilaudid 4 mg at home for the pain.  The pain is unchanged for several months.  2 days ago he had a repeat ultrasound which shows a known small cyst on the left testicle.  Patient was [facility] at [facility] and the cyst was not drained.  They recommended outpatient follow-up however patient is unable to wait for more weeks due to the severity of the pain.  He reports that he has been unable to keep his Dilaudid at home.  Exam is fairly benign with no swelling, descended bilateral testicles with positive cremasteric reflexes, no swelling or erythema.  There is no evidence of testicular torsion.  Patient does have a very small palpable cyst on the left testicle which is the place where he is tender.  There is no indication for emergent urology consultation at this time or repeat imaging.  We will try to keep his pain under control and have patient follow-up with his urologist.    _________________________ 9:28 PM on 12/19/2017 -----------------------------------------  Pain is improved.  Patient will be discharged home with follow-up with urology.  Return precautions for signs of torsion has been discussed with patient.   As part of my medical decision making, I reviewed the following data within the electronic MEDICAL RECORD NUMBER Nursing notes reviewed and incorporated, Old chart reviewed, Notes from prior ED visits and [place] Controlled Substance Database    Pertinent labs & imaging results that were available during my care of the patient were reviewed by me and considered in my medical decision making (see chart for  details).    ____________________________________________   FINAL CLINICAL IMPRESSION(S) / ED DIAGNOSES  Final diagnoses:  Persistent testicular pain      NEW MEDICATIONS STARTED DURING THIS VISIT:  ED Discharge Orders    None       Note:  This document was prepared using Dragon voice recognition software and may include unintentional dictation errors.    Nita SickleVeronese, Rodman, MD 12/19/17 2129

## 2017-12-19 NOTE — Discharge Instructions (Addendum)
Follow-up with urology.  Return to the emergency room for swelling or redness of your scrotum or abdominal pain.

## 2017-12-21 ENCOUNTER — Encounter (HOSPITAL_COMMUNITY): Payer: Self-pay | Admitting: Emergency Medicine

## 2017-12-21 ENCOUNTER — Emergency Department (HOSPITAL_COMMUNITY)

## 2017-12-21 ENCOUNTER — Emergency Department (HOSPITAL_COMMUNITY)
Admission: EM | Admit: 2017-12-21 | Discharge: 2017-12-22 | Disposition: A | Discharge status: Home / Self Care | Attending: Emergency Medicine | Admitting: Emergency Medicine

## 2017-12-21 DIAGNOSIS — N50812 Left testicular pain: Secondary | ICD-10-CM | POA: Insufficient documentation

## 2017-12-21 DIAGNOSIS — Z79899 Other long term (current) drug therapy: Secondary | ICD-10-CM | POA: Diagnosis not present

## 2017-12-21 DIAGNOSIS — N50819 Testicular pain, unspecified: Secondary | ICD-10-CM

## 2017-12-21 DIAGNOSIS — N509 Disorder of male genital organs, unspecified: Secondary | ICD-10-CM

## 2017-12-21 MED ORDER — KETOROLAC TROMETHAMINE 60 MG/2ML IM SOLN: 60.0000 mg | Freq: Once | INTRAMUSCULAR | Status: DC

## 2017-12-21 MED ORDER — ONDANSETRON HCL 4 MG/2ML IJ SOLN
4.0000 mg | Freq: Once | INTRAMUSCULAR | Status: AC
  Administered 2017-12-21: 4 mg via INTRAVENOUS
  Filled 2017-12-21: qty 2

## 2017-12-21 MED ORDER — PROMETHAZINE HCL 25 MG/ML IJ SOLN
12.5000 mg | Freq: Once | INTRAMUSCULAR | Status: AC
  Administered 2017-12-21: 12.5 mg via INTRAVENOUS
  Filled 2017-12-21: qty 1

## 2017-12-21 MED ORDER — HYDROMORPHONE HCL 1 MG/ML IJ SOLN
1.0000 mg | Freq: Once | INTRAMUSCULAR | Status: AC
  Administered 2017-12-21: 1 mg via INTRAVENOUS
  Filled 2017-12-21: qty 1

## 2017-12-21 MED ORDER — KETOROLAC TROMETHAMINE 30 MG/ML IJ SOLN
30.0000 mg | Freq: Once | INTRAMUSCULAR | Status: AC
  Administered 2017-12-21: 30 mg via INTRAVENOUS
  Filled 2017-12-21: qty 1

## 2017-12-21 MED ORDER — FENTANYL 25 MCG/HR TD PT72
50.0000 ug | MEDICATED_PATCH | TRANSDERMAL | Status: DC
  Administered 2017-12-21: 50 ug via TRANSDERMAL
  Filled 2017-12-21: qty 2

## 2017-12-21 MED ORDER — HYDROMORPHONE HCL 1 MG/ML IJ SOLN
2.0000 mg | Freq: Once | INTRAMUSCULAR | Status: AC
  Administered 2017-12-21: 1 mg via INTRAVENOUS
  Filled 2017-12-21: qty 2

## 2017-12-21 MED ORDER — HYDROMORPHONE HCL 1 MG/ML IJ SOLN
INTRAMUSCULAR | Status: AC
  Administered 2017-12-21: 1 mg via INTRAVENOUS
  Filled 2017-12-21: qty 1

## 2017-12-21 NOTE — ED Provider Notes (Signed)
[facility] EMERGENCY DEPARTMENT Provider Note   CSN: [id number] Arrival date & time: 12/21/17  1806    History   Chief Complaint Chief Complaint  Patient presents with  . Testicle Pain    HPI Lance Carter is a 32 y.o. male.  32 year old male presents to the ED for complaints of ongoing left testicular pain.  Symptoms began 4 to 5 months ago.  He has been seen 5 times in the emergency department over the past 4 days for persistent discomfort.  History of multiple prior urologic outpatient visits.  Also with prior admission for pain control in April.  Per prior ED note, "He has had an extensive work-up to this point including consultations and evaluations by [place]rology in [place] as well as [facility] at [facility].  He is underwent several testicular ultrasounds as well as CT abdomen pelvis through the scrotum as well as MRI lumbar spine none of which have found a etiology for his testicular pain.  He underwent a spermatic cord block by Dr. Mena GoesEskridge on September 30, 2017 which did not relieve the pain which concerned Dr. Mena GoesEskridge that this pain was non-urologic in nature.  He is being managed with chronic opioid medications through pain clinic at this time with intermittent relief of his symptoms."  The patient reports compliance with his Dilaudid, NSAIDs, gabapentin.  He was prescribed amitriptyline following a urology visit yesterday, but has yet to pick up this medication.  Was previously on fentanyl patches for pain as well.  No new fevers, trauma, voiding issues.   Testicle Pain     Past Medical History:  Diagnosis Date  . Chronic lower back pain 2015   with left lumbar radiculopathy  . History of chicken pox   . S/P discectomy for herniated nucleus pulposus 04/2014    Patient Active Problem List   Diagnosis Date Noted  . 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 Allodynia 10/01/2017  . 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 Intractable pain 09/28/2017  .  Chronic lower back pain   . S/P discectomy for herniated nucleus pulposus 04/26/2014    Past Surgical History:  Procedure Laterality Date  . INGUINAL HERNIA REPAIR     as child  . 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 Authorizing Provider  acetaminophen (TYLENOL) 325 MG tablet Take 650 mg by mouth every 6 (six) hours as needed for mild pain.    [provider]  amLODipine (NORVASC) 10 MG tablet Take 1 tablet (10 mg total) by mouth daily. 10/07/17   Leroy SeaSingh, Prashant K, MD  baclofen (LIORESAL) 10 MG tablet Take 10 mg by mouth daily as needed for muscle spasms.    [provider]  bisacodyl (DULCOLAX) 5 MG EC tablet Take 2 tablets (10 mg total) by mouth 2 (two) times daily as needed for moderate constipation. 10/07/17   Leroy SeaSingh, Prashant K, MD  celecoxib (CELEBREX) 200 MG capsule Take 1 capsule (200 mg total) by mouth 2 (two) times daily. 10/07/17   Leroy SeaSingh, Prashant K, MD  FLUoxetine (PROZAC) 20 MG capsule Take 20 mg by mouth daily. 09/25/17   [provider]  fluticasone (FLONASE) 50 MCG/ACT nasal spray Place 1 spray into both nostrils 4 (four) times daily as needed for allergies or rhinitis.    [provider]  HYDROmorphone (DILAUDID) 2 MG  tablet Take 1 tablet (2 mg total) by mouth every 8 (eight) hours as needed for severe pain. 10/07/17   Leroy Sea, MD  ondansetron (ZOFRAN) 4 MG tablet Take 1 tablet (4 mg total) by mouth every 8 (eight) hours as needed for nausea or vomiting. 10/07/17   Leroy Sea, MD  pantoprazole (PROTONIX) 40 MG tablet Take 1 tablet (40 mg total) by mouth daily. 10/07/17   Leroy Sea, MD  pramipexole (MIRAPEX) 0.25 MG tablet Take 0.25 mg by mouth at bedtime as needed (restless legs).     [provider]  pregabalin (LYRICA) 100 MG capsule Take 1 capsule (100 mg total) by mouth 3 (three)  times daily. If this medication is not approved by her insurance go back to your previous dose of Neurontin. 10/07/17   Leroy Sea, MD    Family History Family History  Problem Relation Age of Onset  . Alcohol abuse Maternal Grandmother        cirrhosis, rest of maternal side  . Diabetes Paternal Grandfather     Social History Social History   Tobacco Use  . Smoking status: Never Smoker  . Smokeless tobacco: Never Used  Substance Use Topics  . Alcohol use: Yes    Alcohol/week: 0.0 oz    Comment: rarely  . Drug use: No     Allergies   Sulfa antibiotics   Review of Systems Review of Systems  Genitourinary: Positive for testicular pain.  Ten systems reviewed and are negative for acute change, except as noted in the HPI.    Physical Exam Updated Vital Signs BP (!) 140/93 (BP Location: Right Arm)   Pulse 87   Temp 98.4 F (36.9 C) (Oral)   Resp 16   Wt 93 kg (205 lb)   SpO2 100%   BMI 29.41 kg/m   Physical Exam  Constitutional: He is oriented to person, place, and time. He appears well-developed and well-nourished. No distress.  Appears agitated, uncomfortable.  HENT:  Head: Normocephalic and atraumatic.  Eyes: Conjunctivae and EOM are normal. No scleral icterus.  Neck: Normal range of motion.  Pulmonary/Chest: Effort normal. No respiratory distress.  Respirations even and unlabored  Abdominal: Soft. He exhibits no distension and no mass. There is no tenderness. There is no guarding.  Soft, nontender abdomen.  Genitourinary:  Genitourinary Comments: Deferred as completed in triage. See prior note for exam.  Musculoskeletal: Normal range of motion.  Neurological: He is alert and oriented to person, place, and time. He exhibits normal muscle tone. Coordination normal.  Moving all extremities.  Skin: Skin is warm and dry. No rash noted. He is not diaphoretic. No erythema. No pallor.  Psychiatric: He has a normal mood and affect. His behavior is normal.    Nursing note and vitals reviewed.    ED Treatments / Results  Labs (all labs ordered are listed, but only abnormal results are displayed) Labs Reviewed - No data to display  EKG None  Radiology US Scrotum W/doppler  Result Date: 12/21/2017 CLINICAL DATA:  Persistent left testicular pain for 3 months. History of cyst at [facility] per report EXAM: SCROTAL ULTRASOUND DOPPLER ULTRASOUND OF THE TESTICLES TECHNIQUE: Complete ultrasound examination of the testicles, epididymis, and other scrotal structures was performed. Color and spectral Doppler ultrasound were also utilized to evaluate blood flow to the testicles. COMPARISON:  09/27/2017. FINDINGS: Right testicle Measurements: 45 x 20 x 31 mm. No mass or microlithiasis visualized. Left testicle Measurements: 45 x 20 x 28  mm. 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 The cyst is stable and has also been reported on sonography from [facility] in 2018 and at [facility] a few days ago. Right epididymis:  Normal in size and appearance. Left epididymis:  Normal in size and appearance. Hydrocele:  None visualized. Varicocele:  Borderline on the left Pulsed Doppler interrogation of both testes demonstrates normal low resistance arterial and venous waveforms bilaterally. IMPRESSION: 1. No acute finding including testicular torsion. 2. 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 This was also reported on a scrotal ultrasound at outside hospital three days ago and on an ultrasound from [facility] in 2018. Electronically Signed   By: Marnee Spring M.D.   On: 12/21/2017 20:19    Procedures Procedures (including critical care time)  Medications Ordered in ED Medications  fentaNYL (DURAGESIC - dosed mcg/hr) patch 50 mcg (50 mcg Transdermal Patch Applied 12/21/17 2357)  HYDROmorphone (DILAUDID) injection 2 mg (1 mg Intravenous Given 12/21/17 1907)  ondansetron (ZOFRAN) injection 4 mg (4 mg Intravenous Given 12/21/17 2044)   promethazine (PHENERGAN) injection 12.5 mg (12.5 mg Intravenous Given 12/21/17 2312)  ketorolac (TORADOL) 30 MG/ML injection 30 mg (30 mg Intravenous Given 12/21/17 2315)  HYDROmorphone (DILAUDID) injection 1 mg (1 mg Intravenous Given 12/21/17 2358)    11:21 PM Spoke with Dr. Retta Diones of [facility].  Dr. Retta Diones is familiar with this patient and, unfortunately, is unable to provide any additional recommendations for symptomatic control.  He emphasizes that he does not believe an orchiectomy will alleviate the patient's pain.  States that he sympathize is with the patient's ongoing discomfort, but is unsure what further urologic intervention would be helpful in this instance.  Encourages continued outpatient follow up at this time.  12:27 AM Pain down to 5/10.  Patient tolerating Sprite.  Patient verbalizes understanding of the limits of this department.  Will continue with outpatient follow-up.   Initial Impression / Assessment and Plan / ED Course  I have reviewed the triage vital signs and the nursing notes.  Pertinent labs & imaging results that were available during my care of the patient were reviewed by me and considered in my medical decision making (see chart for details).     32 year old male presents to the emergency department for complaints of persistent left testicle pain.  He has been seen by numerous urologic specialists and had multiple normal scrotal ultrasounds.  Also with prior MRI to evaluate pain.  Is followed by pain management for this.  On chronic Dilaudid.  He has had some improvement to his pain with IV Dilaudid and a fentanyl patch.  Repeat ultrasound today stable compared with prior imaging.  I do not see indication for further emergent work-up or admission for pain control.  Urology was consulted and encourage continued outpatient follow-up.  The patient has an appointment scheduled for Monday  Return precautions discussed and provided. Patient discharged in  stable condition with no unaddressed concerns.   Final Clinical Impressions(s) / ED Diagnoses   Final diagnoses:  Testicle pain  Persistent pain in testicle    ED Discharge Orders    None       Antony Madura, PA-C 12/22/17 0036    Loren Racer, MD 12/25/17 1515

## 2017-12-21 NOTE — ED Notes (Signed)
See EDP assessment / note.   

## 2017-12-21 NOTE — ED Provider Notes (Addendum)
MSE was initiated and I personally evaluated the patient and placed orders (if any) at  6:49 PM on December 21, 2017.  The patient appears stable so that the remainder of the MSE may be completed by another provider.  Patient placed in Quick Look pathway, seen and evaluated   Chief Complaint: L testicle pain  HPI:   Patient presents to ED for evaluation of left testicle pain.  Pain has been persistent for about 4 months.  Patient has been evaluated at several ED's, several urology offices with negative ultrasound.  There was an ultrasound done several days ago which showed a small left-sided testicular cyst.  He has been taking p.o. Dilaudid at home but states that he is unable to keep anything down even with the use of his Zofran.  The earliest urology follow-up appointment the patient has with any of the urology offices in the area is not for another 3 days.  He states that no one is willing to operate to remove his testicle.  ROS: testicular pain  Physical Exam:   Gen: No distress  Neuro: Awake and Alert  Skin: Warm    Focused Exam: point tenderness of L testicle. Cremasteric reflex intact. No edema or erythema of testicle noted. Tech served as Occupational hygienistchaperone during examination.   Initiation of care has begun. The patient has been counseled on the process, plan, and necessity for staying for the completion/evaluation, and the remainder of the medical screening examination      Dietrich PatesKhatri, Sirenity Shew, PA-C 12/21/17 1932    Loren RacerYelverton, David, MD 12/23/17 [phone]

## 2017-12-22 NOTE — ED Notes (Signed)
Discharge instructions discussed with Pt. Pt verbalized understanding. Pt stable and ambulatory.    

## 2018-04-03 ENCOUNTER — Emergency Department (HOSPITAL_COMMUNITY)
Admission: EM | Admit: 2018-04-03 | Discharge: 2018-04-03 | Disposition: A | Discharge status: Home / Self Care | Attending: Emergency Medicine | Admitting: Emergency Medicine

## 2018-04-03 ENCOUNTER — Encounter (HOSPITAL_COMMUNITY): Payer: Self-pay | Admitting: Emergency Medicine

## 2018-04-03 DIAGNOSIS — Z79899 Other long term (current) drug therapy: Secondary | ICD-10-CM | POA: Diagnosis not present

## 2018-04-03 DIAGNOSIS — K5641 Fecal impaction: Secondary | ICD-10-CM | POA: Insufficient documentation

## 2018-04-03 DIAGNOSIS — M545 Low back pain: Secondary | ICD-10-CM | POA: Diagnosis present

## 2018-04-03 LAB — I-STAT CHEM 8, ED
BUN: 13 mg/dL (ref 6–20)
CALCIUM ION: 1.14 mmol/L — AB (ref 1.15–1.40)
CHLORIDE: 104 mmol/L (ref 98–111)
Creatinine, Ser: 0.9 mg/dL (ref 0.61–1.24)
GLUCOSE: 90 mg/dL (ref 70–99)
HCT: 39 % (ref 39.0–52.0)
Hemoglobin: 13.3 g/dL (ref 13.0–17.0)
POTASSIUM: 3.5 mmol/L (ref 3.5–5.1)
SODIUM: 141 mmol/L (ref 135–145)
TCO2: 27 mmol/L (ref 22–32)

## 2018-04-03 LAB — CBC
HEMATOCRIT: 42.5 % (ref 39.0–52.0)
HEMOGLOBIN: 13.9 g/dL (ref 13.0–17.0)
MCH: 28 pg (ref 26.0–34.0)
MCHC: 32.7 g/dL (ref 30.0–36.0)
MCV: 85.7 fL (ref 80.0–100.0)
NRBC: 0 % (ref 0.0–0.2)
Platelets: 243 10*3/uL (ref 150–400)
RBC: 4.96 MIL/uL (ref 4.22–5.81)
RDW: 11.1 % — AB (ref 11.5–15.5)
WBC: 8.5 10*3/uL (ref 4.0–10.5)

## 2018-04-03 MED ORDER — ONDANSETRON HCL 4 MG/2ML IJ SOLN
4.0000 mg | Freq: Once | INTRAMUSCULAR | Status: AC
  Administered 2018-04-03: 4 mg via INTRAVENOUS
  Filled 2018-04-03: qty 2

## 2018-04-03 MED ORDER — MILK AND MOLASSES ENEMA
1.0000 | Freq: Once | RECTAL | Status: AC
  Administered 2018-04-03: 250 mL via RECTAL
  Filled 2018-04-03: qty 250

## 2018-04-03 MED ORDER — HYDROMORPHONE HCL 1 MG/ML IJ SOLN
1.0000 mg | Freq: Once | INTRAMUSCULAR | Status: AC
  Administered 2018-04-03: 1 mg via INTRAVENOUS
  Filled 2018-04-03: qty 1

## 2018-04-03 NOTE — ED Provider Notes (Signed)
[facility] EMERGENCY DEPARTMENT Provider Note   CSN: [id number] Arrival date & time: 04/03/18  1155     History   Chief Complaint No chief complaint on file.   HPI Marquavis Hannen is a 32 y.o. male.  HPI  32 year old male with chronic history of lower back pain and a recent left-sided scrotal surgery comes in with chief complaint of severe pain. Patient reports that he had his surgery about 2 days ago.  Yesterday during his bowel movement he had to strain significantly, and soon after he started having severe pain.  He also noted that his bowel movement was bloody.  Patient is now having constant pain in the rectal region, to the point where it is impossible for him to relax and he has had any urination since last night.  No history of similar pain in the past.  Past Medical History:  Diagnosis Date  . Chronic lower back pain 2015   with left lumbar radiculopathy  . History of chicken pox   . S/P discectomy for herniated nucleus pulposus 04/2014    Patient Active Problem List   Diagnosis Date Noted  . 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 Allodynia 10/01/2017  . 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 Intractable pain 09/28/2017  . Chronic lower back pain   . S/P discectomy for herniated nucleus pulposus 04/26/2014    Past Surgical History:  Procedure Laterality Date  . INGUINAL HERNIA REPAIR     as child  . 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 Authorizing Provider  acetaminophen (TYLENOL) 325 MG tablet Take 650 mg by mouth every 6 (six) hours as needed for mild pain.   Yes [provider]  baclofen (LIORESAL) 10 MG tablet Take 10 mg by mouth daily as needed for muscle spasms.   Yes [provider]  bisacodyl (DULCOLAX) 5 MG EC tablet Take 2 tablets (10 mg total) by mouth 2 (two) times daily as  needed for moderate constipation. 10/07/17  Yes Leroy Sea, MD  fentaNYL (DURAGESIC - DOSED MCG/HR) 50 MCG/HR Place 1 patch onto the skin every 3 (three) days. 03/29/18  Yes [provider]  FLUoxetine (PROZAC) 20 MG capsule Take 20 mg by mouth daily. 09/25/17  Yes [provider]  gabapentin (NEURONTIN) 300 MG capsule Take 300 mg by mouth 4 (four) times daily. 03/01/18  Yes [provider]  HYDROcodone-acetaminophen (NORCO/VICODIN) 5-325 MG tablet Take 1 tablet by mouth every 6 (six) hours as needed for pain. 04/01/18  Yes [provider]  ibuprofen (ADVIL,MOTRIN) 200 MG tablet Take 400-600 mg by mouth every 6 (six) hours as needed for mild pain.   Yes [provider]  meloxicam (MOBIC) 7.5 MG tablet Take 7.5 mg by mouth 2 (two) times daily. 04/01/18  Yes [provider]  metaxalone (SKELAXIN) 800 MG tablet Take 800 mg by mouth as needed. 01/29/18  Yes [provider]  ondansetron (ZOFRAN) 4 MG tablet Take 1 tablet (4 mg total) by mouth every 8 (eight) hours as needed for nausea or vomiting. 10/07/17  Yes Leroy Sea, MD  pantoprazole (PROTONIX) 40 MG tablet Take 1 tablet (40 mg total) by mouth daily. 10/07/17  Yes Leroy Sea, MD  pramipexole (MIRAPEX) 0.25 MG tablet Take 0.25 mg by mouth at bedtime as needed (restless legs).  Yes [provider]  Sennosides (EX-LAX PO) Take 1 tablet by mouth as needed (constipation).   Yes [provider]  amLODipine (NORVASC) 10 MG tablet Take 1 tablet (10 mg total) by mouth daily. Patient not taking: Reported on 04/03/2018 10/07/17   Leroy Sea, MD  celecoxib (CELEBREX) 200 MG capsule Take 1 capsule (200 mg total) by mouth 2 (two) times daily. Patient not taking: Reported on 04/03/2018 10/07/17   Leroy Sea, MD  HYDROmorphone (DILAUDID) 2 MG tablet Take 1 tablet (2 mg total) by mouth every 8 (eight) hours as needed for severe pain. Patient not taking: Reported on  04/03/2018 10/07/17   Leroy Sea, MD  pregabalin (LYRICA) 100 MG capsule Take 1 capsule (100 mg total) by mouth 3 (three) times daily. If this medication is not approved by her insurance go back to your previous dose of Neurontin. Patient not taking: Reported on 04/03/2018 10/07/17   Leroy Sea, MD    Family History Family History  Problem Relation Age of Onset  . Alcohol abuse Maternal Grandmother        cirrhosis, rest of maternal side  . Diabetes Paternal Grandfather     Social History Social History   Tobacco Use  . Smoking status: Never Smoker  . Smokeless tobacco: Never Used  Substance Use Topics  . Alcohol use: Yes    Alcohol/week: 0.0 standard drinks    Comment: rarely  . Drug use: No     Allergies   Sulfa antibiotics   Review of Systems Review of Systems  Constitutional: Positive for activity change.  Gastrointestinal: Positive for abdominal pain. Negative for nausea and vomiting.  Genitourinary: Positive for decreased urine volume. Negative for flank pain and testicular pain.  Musculoskeletal: Positive for back pain.  Allergic/Immunologic: Negative for immunocompromised state.  Hematological: Does not bruise/bleed easily.  All other systems reviewed and are negative.    Physical Exam Updated Vital Signs BP 115/84   Pulse 63   Temp 98.2 F (36.8 C) (Oral)   Resp 18   Ht 5\' 10"  (1.778 m)   Wt 97.5 kg   SpO2 92%   BMI 30.85 kg/m   Physical Exam  Constitutional: He is oriented to person, place, and time. He appears well-developed.  HENT:  Head: Atraumatic.  Neck: Neck supple.  Cardiovascular: Normal rate.  Pulmonary/Chest: Effort normal.  Abdominal: Soft.  Digital rectal exam reveals large stool proximal in the rectal vault.  Genitourinary:  Genitourinary Comments: Left sided suprapubic incision wound with surrounding ecchymosis. Testicles are free moving but tender.  Neurological: He is alert and oriented to person, place, and time.   Skin: Skin is warm.  Nursing note and vitals reviewed.    ED Treatments / Results  Labs (all labs ordered are listed, but only abnormal results are displayed) Labs Reviewed  CBC - Abnormal; Notable for the following components:      Result Value   RDW 11.1 (*)    All other components within normal limits  I-STAT CHEM 8, ED - Abnormal; Notable for the following components:   Calcium, Ion 1.14 (*)    All other components within normal limits    EKG None  Radiology No results found.  Procedures Procedures (including critical care time)  Medications Ordered in ED Medications  milk and molasses enema (250 mLs Rectal Given 04/03/18 1435)  HYDROmorphone (DILAUDID) injection 1 mg (1 mg Intravenous Given 04/03/18 1337)  ondansetron (ZOFRAN) injection 4 mg (4 mg Intravenous Given  04/03/18 1337)     Initial Impression / Assessment and Plan / ED Course  I have reviewed the triage vital signs and the nursing notes.  Pertinent labs & imaging results that were available during my care of the patient were reviewed by me and considered in my medical decision making (see chart for details).     32 year old male comes in with chief complaint of significant pain.  His pain has been constant since last night and excruciating.  Patient unable to relax and has not urinated because of that.  He feels like his rectal sphincter is constantly tends and that he has to go.  Surgical wound site appears normal.  Clinically it appeared that patient's symptoms could have been because of herniation of rectal content, however our exam revealed a large stool bolus in the rectal vault proximally.  Patient agreed to enema over digital disimpaction, and post enema he had a bowel movement and feeling a lot better.  It appears that patient has some chronic constipation, however this recent surgery likely made his symptoms worse.  His pain was all likely because of distention and spasm of the distal colon.   Patient is comfortable going home with his mother now.  Final Clinical Impressions(s) / ED Diagnoses   Final diagnoses:  Fecal impaction in rectum [facility])    ED Discharge Orders    None       Derwood Kaplan, MD 04/03/18 1702

## 2018-04-03 NOTE — Discharge Instructions (Addendum)
We saw in the ER for severe pain. It appears that your pain was because of severe constipation and fecal impaction within the rectum.  We recommend that you continue taking Colace for the next 5 to 7 days at least regularly along with increased fluid and high fiber in your diet.

## 2018-04-03 NOTE — ED Notes (Signed)
Patient verbalizes understanding of discharge instructions. Opportunity for questioning and answers were provided. Armband removed by staff, pt discharged from ED.  

## 2018-04-03 NOTE — ED Notes (Signed)
Pt ambulating back and forth to bathroom to have bowel movements.

## 2018-04-03 NOTE — ED Notes (Signed)
No bleeding noted to surgical site.

## 2018-04-03 NOTE — ED Triage Notes (Signed)
Pt here via [facility], had surgery Monday at [facility] to remove a cyst on left testicle, first BM since surgery last night was strained, afterwards had bleeding that was bright red for 15 minutes.  Since then pt feels like he has had pressure on his rectum and increased pain that feels better when "clenched". Pt has 50 mcg fentanyl patch on left shoulder. 119/81, P 87, RR 20, 95% RA.

## 2018-04-03 NOTE — ED Notes (Signed)
Got patient into a gown on the monitor patient is resting with call bell in reach 

## 2018-09-27 ENCOUNTER — Other Ambulatory Visit: Payer: Self-pay

## 2018-09-27 ENCOUNTER — Ambulatory Visit (HOSPITAL_COMMUNITY)
Admission: EM | Admit: 2018-09-27 | Discharge: 2018-09-27 | Disposition: A | Discharge status: Home / Self Care | Attending: Family Medicine | Admitting: Family Medicine

## 2018-09-27 ENCOUNTER — Encounter (HOSPITAL_COMMUNITY): Payer: Self-pay | Admitting: Emergency Medicine

## 2018-09-27 DIAGNOSIS — W269XXA Contact with unspecified sharp object(s), initial encounter: Secondary | ICD-10-CM

## 2018-09-27 DIAGNOSIS — Z23 Encounter for immunization: Secondary | ICD-10-CM | POA: Diagnosis not present

## 2018-09-27 DIAGNOSIS — S61212A Laceration without foreign body of right middle finger without damage to nail, initial encounter: Secondary | ICD-10-CM

## 2018-09-27 MED ORDER — TETANUS-DIPHTH-ACELL PERTUSSIS 5-2.5-18.5 LF-MCG/0.5 IM SUSP
INTRAMUSCULAR | Status: AC
  Filled 2018-09-27: qty 0.5

## 2018-09-27 MED ORDER — TETANUS-DIPHTH-ACELL PERTUSSIS 5-2.5-18.5 LF-MCG/0.5 IM SUSP
0.5000 mL | Freq: Once | INTRAMUSCULAR | Status: AC
  Administered 2018-09-27: 0.5 mL via INTRAMUSCULAR

## 2018-09-27 NOTE — Discharge Instructions (Signed)
Cleanse daily with soap and water.  Keep covered to keep clean and to keep pressure on skin flap.  Wound is nice and intact and approximated currently, no indication for further closure at this time.  Ice and elevation to help with pain.  If develop any signs of infection please return- increased pain, redness, drainage.

## 2018-09-27 NOTE — ED Triage Notes (Signed)
Pt here for laceration to tip of right middle finger with metal from dishwasher
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
## 2018-11-05 ENCOUNTER — Ambulatory Visit (HOSPITAL_COMMUNITY)
Admission: EM | Admit: 2018-11-05 | Discharge: 2018-11-05 | Disposition: A | Discharge status: Home / Self Care | Attending: Family Medicine | Admitting: Family Medicine

## 2018-11-05 ENCOUNTER — Other Ambulatory Visit: Payer: Self-pay

## 2018-11-05 DIAGNOSIS — R42 Dizziness and giddiness: Secondary | ICD-10-CM

## 2018-11-05 DIAGNOSIS — H8110 Benign paroxysmal vertigo, unspecified ear: Secondary | ICD-10-CM

## 2018-11-05 LAB — BASIC METABOLIC PANEL
Anion gap: 9 (ref 5–15)
BUN: 7 mg/dL (ref 6–20)
CO2: 25 mmol/L (ref 22–32)
Calcium: 9.8 mg/dL (ref 8.9–10.3)
Chloride: 107 mmol/L (ref 98–111)
Creatinine, Ser: 0.89 mg/dL (ref 0.61–1.24)
GFR calc Af Amer: >60 mL/min (ref >60)
GFR calc non Af Amer: >60 mL/min (ref >60)
Glucose, Bld: 94 mg/dL (ref 70–99)
Potassium: 3.9 mmol/L (ref 3.5–5.1)
Sodium: 141 mmol/L (ref 135–145)

## 2018-11-05 LAB — CBC
HCT: 47.1 % (ref 39.0–52.0)
Hemoglobin: 16.5 g/dL (ref 13.0–17.0)
MCH: 29 pg (ref 26.0–34.0)
MCHC: 35 g/dL (ref 30.0–36.0)
MCV: 82.8 fL (ref 80.0–100.0)
Platelets: 360 10*3/uL (ref 150–400)
RBC: 5.69 MIL/uL (ref 4.22–5.81)
RDW: 11.7 % (ref 11.5–15.5)
WBC: 5.7 10*3/uL (ref 4.0–10.5)
nRBC: 0 % (ref 0.0–0.2)

## 2018-11-05 NOTE — Discharge Instructions (Signed)
I will call with results of blood work if abnormal- checking electrolytes/hemoglobin Symptoms most likely from vertigo Please youtube/google Epley maneuver and log roll/bbq maneuver for vertigo- please try doing these 8-10 times in 1 sitting, 2-3 times a day  Drink plenty of fluids Make slow movements  Follow up if symptoms worsening or changing

## 2018-11-05 NOTE — ED Triage Notes (Signed)
Pt states for the last few days hes had vertigo type feelings, states turning his head or standing up in certain positions hes felt very dizzy. Ambulatory with steady gait.
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
## 2019-03-08 IMAGING — MR MR PELVIS WO/W CM
9 of 11 series · 38 of 48 positions shown · IV contrast (multihance)
Comparison: Scrotal ultrasound dated 09/27/2017. CT abdomen/pelvis
dated 09/27/2017.

CLINICAL DATA: Left testicular pain x1 month

EXAM:
MRI PELVIS WITHOUT AND WITH CONTRAST
TECHNIQUE: Multiplanar multisequence MR imaging of the pelvis was performed
both before and after administration of intravenous contrast.
CONTRAST:  20mL MULTIHANCE GADOBENATE DIMEGLUMINE 529 MG/ML IV SOLN

[Series 3: T2 · axial · 4.0mm · 0.62mm/px · z∈[+34,+151]mm · 3 of 27 slices shown (1 of 4)]
[im 1/27]
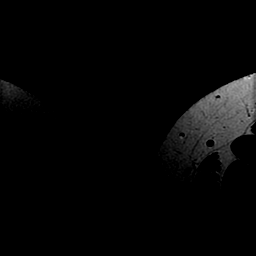
[im 14/27]
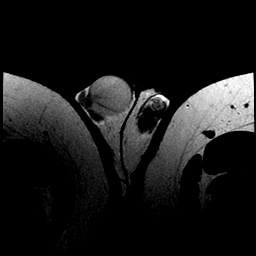
[im 27/27]
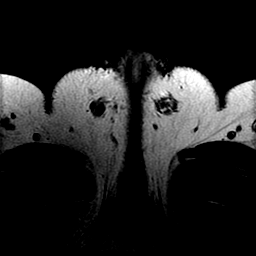

[Series 5: T2 · coronal · 4.0mm · 0.62mm/px · 3 of 25 slices shown (2 of 4)]
[im 1/25]
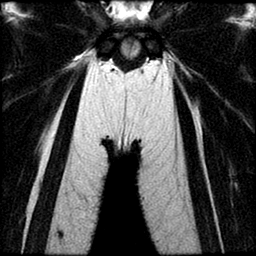
[im 13/25]
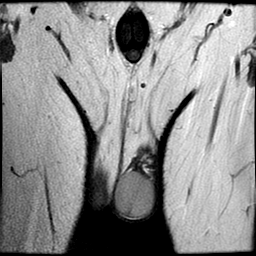
[im 25/25]
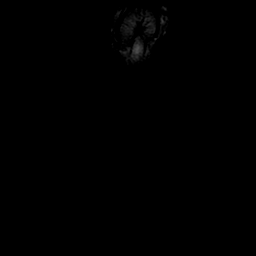

[Series 7: T2 · sagittal · 4.0mm · 0.62mm/px · 2 of 20 slices shown (3 of 4)]
[im 1/20]
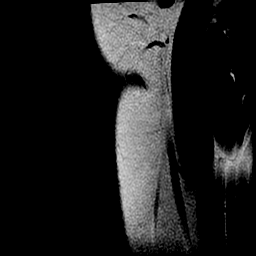
[im 20/20]
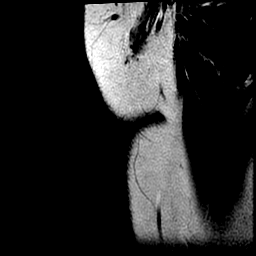

[Series 8: T2 fat-sat · axial · 4.0mm · 0.62mm/px · z∈[+34,+151]mm · 2 of 27 slices shown]
[im 1/27]
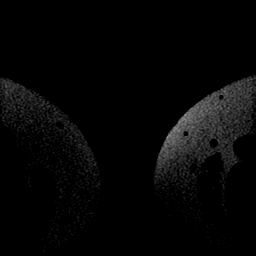
[im 27/27]
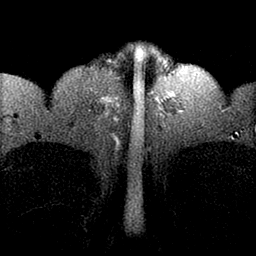

[Series 10: T1 · axial · 5.0mm · 1.33mm/px · z∈[+48,+372]mm · 5 of 55 slices shown]
[im 1/55]
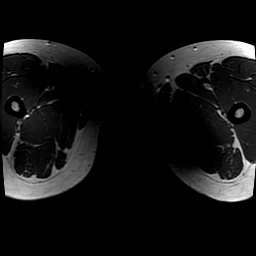
[im 14/55]
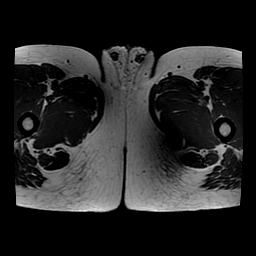
[im 28/55]
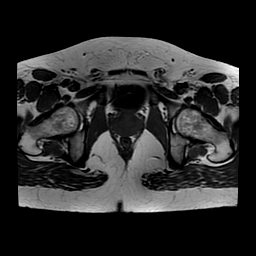
[im 41/55]
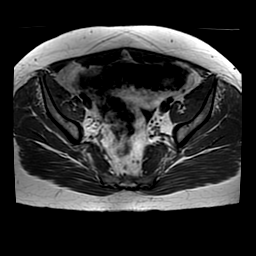
[im 55/55]
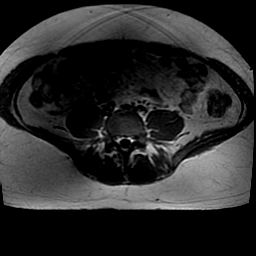

[Series 11: T2 · axial · 5.0mm · 1.33mm/px · z∈[+48,+372]mm · 5 of 55 slices shown (4 of 4)]
[im 1/55]
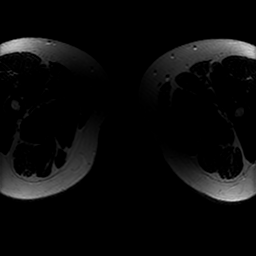
[im 14/55]
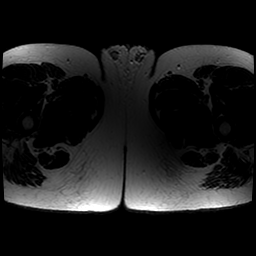
[im 28/55]
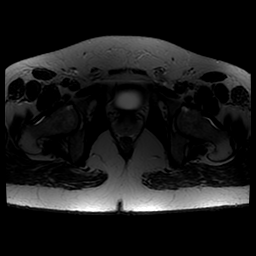
[im 41/55]
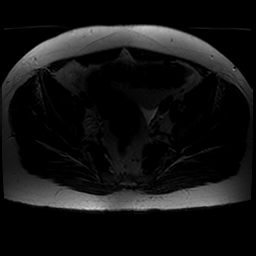
[im 55/55]
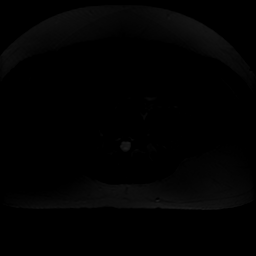

[Series 13: DWI · axial · 4.0mm · 0.62mm/px · z∈[+34,+151]mm · 7 of 80 slices shown]
[im 1/80]
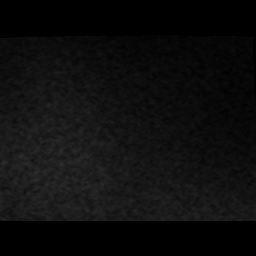
[im 14/80]
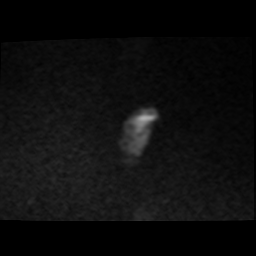
[im 27/80]
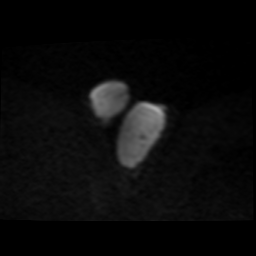
[im 40/80]
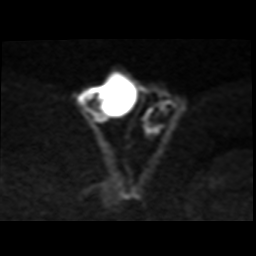
[im 53/80]
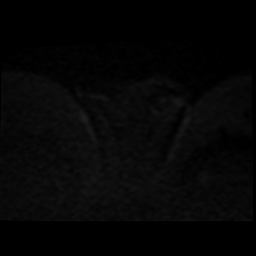
[im 66/80]
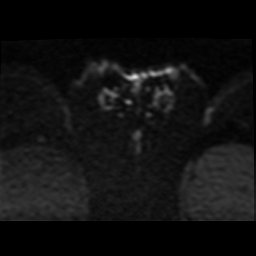
[im 80/80]
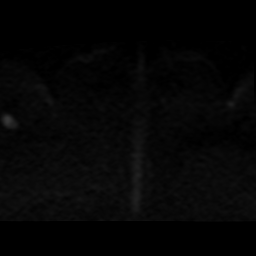

[Series 14: T1 dynamic · axial · non-contrast · 3.0mm · 0.31mm/px · z∈[+30,+154]mm · 7 of 84 slices shown]
[im 1/84]
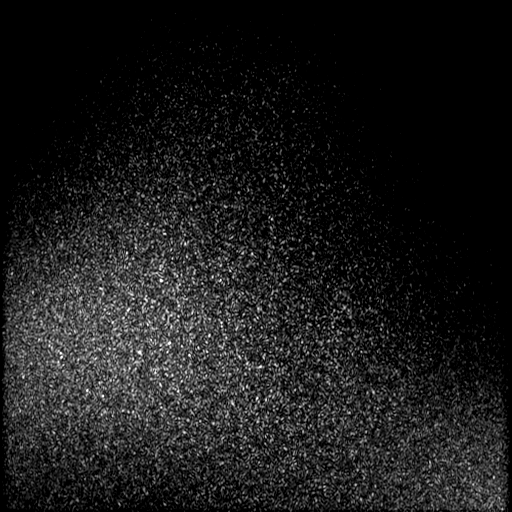
[im 14/84]
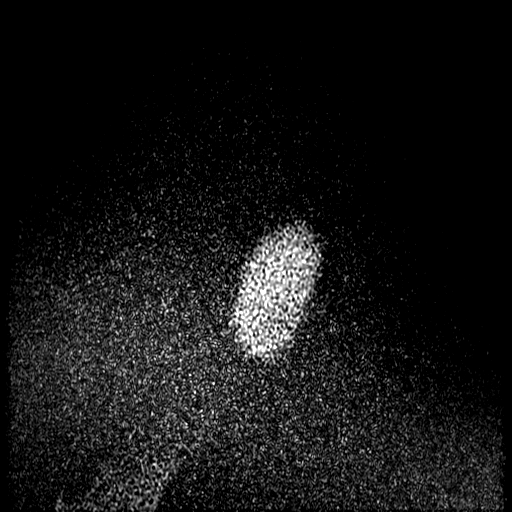
[im 28/84]
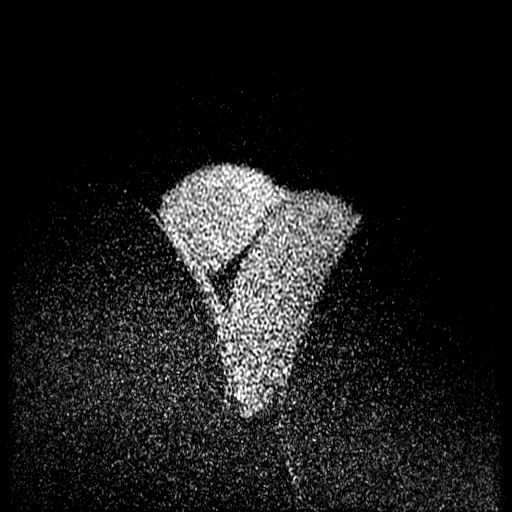
[im 42/84]
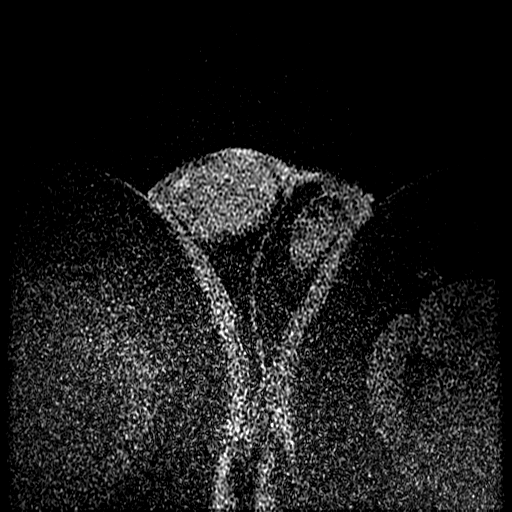
[im 56/84]
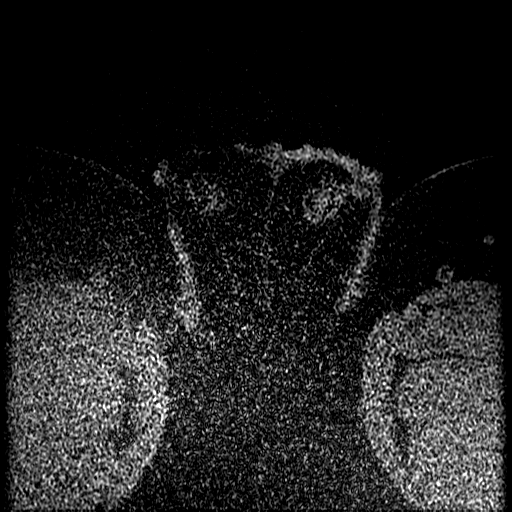
[im 70/84]
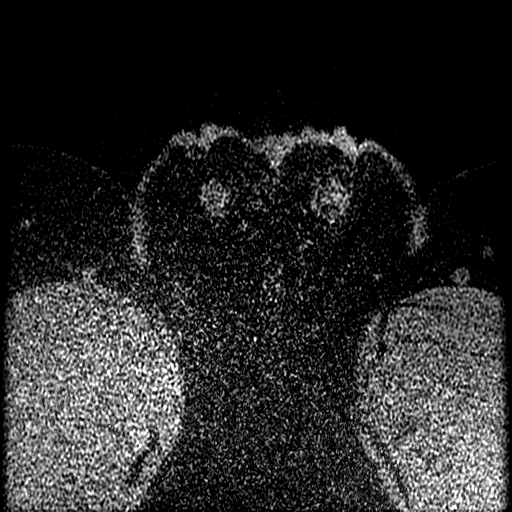
[im 84/84]
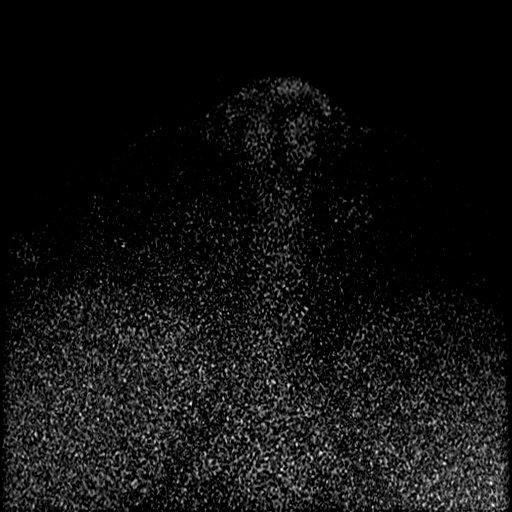

[Series 15: T1 dynamic post-contrast · axial · 3.0mm · 0.31mm/px · z∈[+30,+91]mm · 4 of 84 slices shown]
[im 1/84]
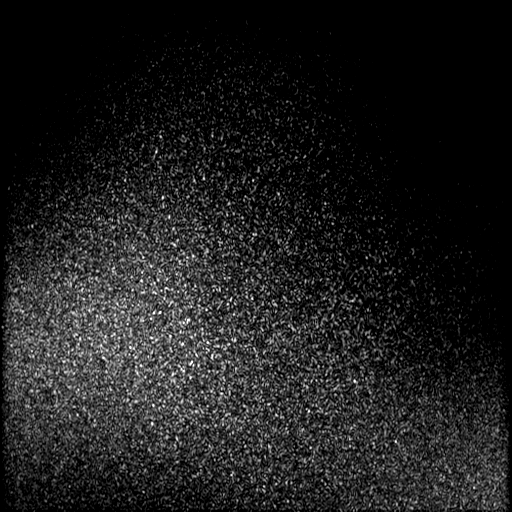
[im 14/84]
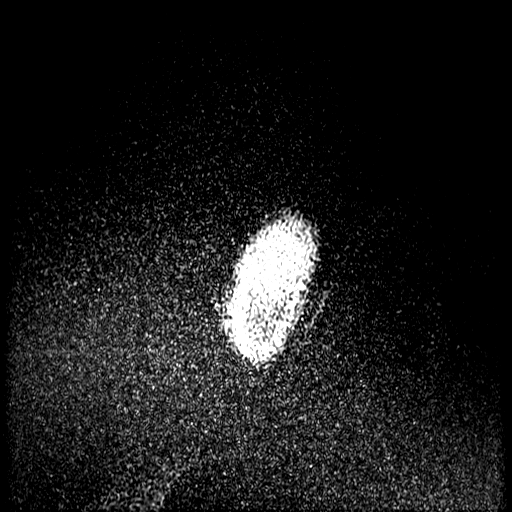
[im 28/84]
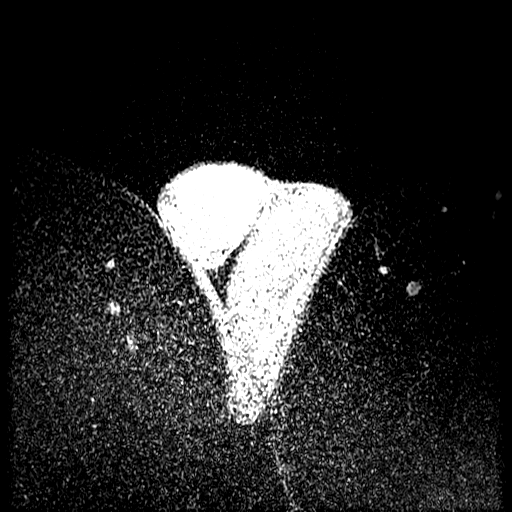
[im 42/84]
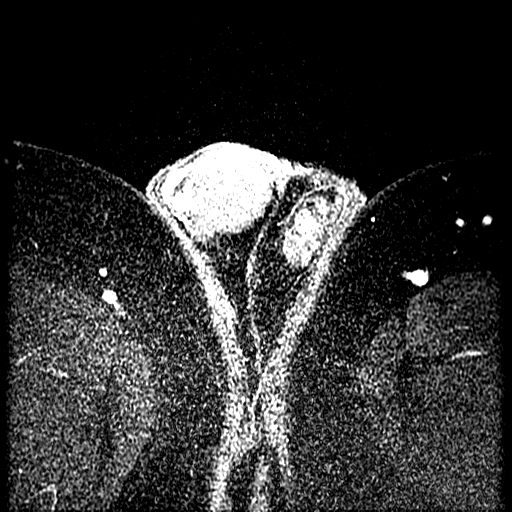

[38 of 48 positions shown; findings below may reference images not displayed]

FINDINGS: Urinary Tract:  Bladder is within normal limits.

Bowel:  Visualized bowel is unremarkable.

Vascular/Lymphatic: No evidence of aneurysm.

No pelvic lymphadenopathy.

Reproductive:  Prostate is unremarkable.

Right testis is within normal limits.

6 x 5 x 6 mm T2 hyperintense lesion in the inferior left testis
(series 3/image 21), without enhancement following contrast
administration (series 17/image 42), compatible benign testicular
cyst.

Other:  Trace left pelvic ascites (series 11/image 14).

Musculoskeletal: No focal osseous lesions.
IMPRESSION: 6 mm benign left testicular cyst.

Trace left pelvic ascites.

## 2019-08-31 ENCOUNTER — Emergency Department (HOSPITAL_COMMUNITY)

## 2019-08-31 ENCOUNTER — Encounter (HOSPITAL_COMMUNITY): Payer: Self-pay | Admitting: *Deleted

## 2019-08-31 ENCOUNTER — Emergency Department (HOSPITAL_COMMUNITY)
Admission: EM | Admit: 2019-08-31 | Discharge: 2019-08-31 | Disposition: A | Discharge status: Home / Self Care | Attending: Emergency Medicine | Admitting: Emergency Medicine

## 2019-08-31 ENCOUNTER — Other Ambulatory Visit: Payer: Self-pay

## 2019-08-31 DIAGNOSIS — R109 Unspecified abdominal pain: Secondary | ICD-10-CM | POA: Diagnosis not present

## 2019-08-31 DIAGNOSIS — N50812 Left testicular pain: Secondary | ICD-10-CM | POA: Diagnosis not present

## 2019-08-31 DIAGNOSIS — Z79899 Other long term (current) drug therapy: Secondary | ICD-10-CM | POA: Diagnosis not present

## 2019-08-31 LAB — CBC WITH DIFFERENTIAL/PLATELET
Abs Immature Granulocytes: 0.01 10*3/uL (ref 0.00–0.07)
Basophils Absolute: 0 10*3/uL (ref 0.0–0.1)
Basophils Relative: 0 %
Eosinophils Absolute: 0.2 10*3/uL (ref 0.0–0.5)
Eosinophils Relative: 3 %
HCT: 42.2 % (ref 39.0–52.0)
Hemoglobin: 14.7 g/dL (ref 13.0–17.0)
Immature Granulocytes: 0 %
Lymphocytes Relative: 34 %
Lymphs Abs: 2 10*3/uL (ref 0.7–4.0)
MCH: 30.6 pg (ref 26.0–34.0)
MCHC: 34.8 g/dL (ref 30.0–36.0)
MCV: 87.7 fL (ref 80.0–100.0)
Monocytes Absolute: 0.4 10*3/uL (ref 0.1–1.0)
Monocytes Relative: 6 %
Neutro Abs: 3.4 10*3/uL (ref 1.7–7.7)
Neutrophils Relative %: 57 %
Platelets: 302 10*3/uL (ref 150–400)
RBC: 4.81 MIL/uL (ref 4.22–5.81)
RDW: 11.8 % (ref 11.5–15.5)
WBC: 5.9 10*3/uL (ref 4.0–10.5)
nRBC: 0 % (ref 0.0–0.2)

## 2019-08-31 LAB — URINALYSIS, ROUTINE W REFLEX MICROSCOPIC
Bilirubin Urine: NEGATIVE
Glucose, UA: NEGATIVE mg/dL
Hgb urine dipstick: NEGATIVE
Ketones, ur: NEGATIVE mg/dL
Leukocytes,Ua: NEGATIVE
Nitrite: NEGATIVE
Protein, ur: NEGATIVE mg/dL
Specific Gravity, Urine: 1.019 (ref 1.005–1.030)
pH: 7 (ref 5.0–8.0)

## 2019-08-31 LAB — BASIC METABOLIC PANEL
Anion gap: 9 (ref 5–15)
BUN: 11 mg/dL (ref 6–20)
CO2: 27 mmol/L (ref 22–32)
Calcium: 9.5 mg/dL (ref 8.9–10.3)
Chloride: 105 mmol/L (ref 98–111)
Creatinine, Ser: 1.34 mg/dL — ABNORMAL HIGH (ref 0.61–1.24)
GFR calc Af Amer: >60 mL/min (ref >60)
GFR calc non Af Amer: >60 mL/min (ref >60)
Glucose, Bld: 100 mg/dL — ABNORMAL HIGH (ref 70–99)
Potassium: 4 mmol/L (ref 3.5–5.1)
Sodium: 141 mmol/L (ref 135–145)

## 2019-08-31 MED ORDER — KETOROLAC TROMETHAMINE 30 MG/ML IJ SOLN
30.0000 mg | Freq: Once | INTRAMUSCULAR | Status: AC
  Administered 2019-08-31: 30 mg via INTRAVENOUS
  Filled 2019-08-31: qty 1

## 2019-08-31 MED ORDER — ONDANSETRON HCL 4 MG/2ML IJ SOLN
4.0000 mg | Freq: Once | INTRAMUSCULAR | Status: AC
  Administered 2019-08-31: 15:00:00 4 mg via INTRAVENOUS
  Filled 2019-08-31: qty 2

## 2019-08-31 MED ORDER — SODIUM CHLORIDE 0.9 % IV BOLUS (SEPSIS)
1000.0000 mL | Freq: Once | INTRAVENOUS | Status: AC
  Administered 2019-08-31: 1000 mL via INTRAVENOUS

## 2019-08-31 MED ORDER — HYDROMORPHONE HCL 1 MG/ML IJ SOLN: 1.0000 mg | Freq: Once | INTRAMUSCULAR | Status: AC

## 2019-08-31 MED ORDER — MORPHINE SULFATE (PF) 4 MG/ML IV SOLN
4.0000 mg | Freq: Once | INTRAVENOUS | Status: AC
  Administered 2019-08-31: 4 mg via INTRAVENOUS
  Filled 2019-08-31: qty 1

## 2019-08-31 MED ORDER — HYDROMORPHONE HCL 1 MG/ML IJ SOLN
INTRAMUSCULAR | Status: AC
  Administered 2019-08-31: 16:00:00 1 mg via INTRAVENOUS
  Filled 2019-08-31: qty 1

## 2019-08-31 MED ORDER — ONDANSETRON HCL 4 MG/2ML IJ SOLN
4.0000 mg | Freq: Once | INTRAMUSCULAR | Status: AC
  Administered 2019-08-31: 4 mg via INTRAVENOUS
  Filled 2019-08-31: qty 2

## 2019-08-31 MED ORDER — HYDROMORPHONE HCL 1 MG/ML IJ SOLN
1.0000 mg | Freq: Once | INTRAMUSCULAR | Status: AC
  Administered 2019-08-31: 1 mg via INTRAVENOUS
  Filled 2019-08-31: qty 1

## 2019-08-31 MED ORDER — HYDROMORPHONE HCL 1 MG/ML IJ SOLN
INTRAMUSCULAR | Status: AC
  Administered 2019-08-31: 1 mg via INTRAVENOUS
  Filled 2019-08-31: qty 1

## 2019-08-31 NOTE — Discharge Instructions (Signed)
Your work-up today was reassuring and did not show any evidence of testicular torsion or kidney stone.  He will need to follow-up with your urologist.  Call their office and arrange for follow-up.  Return to the emergency department for any worsening pain, fevers, pain with urination or any other worsening or concerning symptoms.

## 2019-08-31 NOTE — ED Triage Notes (Signed)
Pt with left testicle pain severe in last hour.  But over the past few days hasn't felt right per pt. Wife is a Engineer, civil (consulting) and concerned for torsion.

## 2019-08-31 NOTE — ED Notes (Signed)
Patient unable to void 

## 2019-08-31 NOTE — ED Provider Notes (Signed)
[facility] EMERGENCY DEPARTMENT Provider Note   CSN: [id number] Arrival date & time: 08/31/19  1400     History Chief Complaint  Patient presents with  . Testicle Pain    Lance Carter is a 34 y.o. male.  Patient is a 34 year old male with past medical history of chronic low back pain presenting to the emergency department for acute onset of left-sided testicular pain which occurred about 1 hour prior to arrival.  Reports that he was going to sit down to watch a movie when the pain came on suddenly and has been consistent.  Denies any abdominal pain, flank pain, nausea, vomiting.  He does report that he has not urinated all day.  No history of kidney stones.  Also reports that last night he noted to his wife that his left testicle felt abnormal but could not pinpoint the feeling he was having.  He reports he feels like his left testiclel is being ripped out.  Denies any swelling or painful urination.        Past Medical History:  Diagnosis Date  . Chronic lower back pain 2015   with left lumbar radiculopathy  . History of chicken pox   . S/P discectomy for herniated nucleus pulposus 04/2014    Patient Active Problem List   Diagnosis Date Noted  . 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 Allodynia 10/01/2017  . 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 Intractable pain 09/28/2017  . Chronic lower back pain   . S/P discectomy for herniated nucleus pulposus 04/26/2014    Past Surgical History:  Procedure Laterality Date  . INGUINAL HERNIA REPAIR     as child  . 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 Alcohol abuse Maternal Grandmother        cirrhosis, rest of maternal side  . Diabetes Paternal Grandfather     Social History   Tobacco Use  . Smoking status: Never Smoker  . Smokeless tobacco: Never Used  Substance Use Topics  . Alcohol use: Yes    Alcohol/week: 0.0 standard drinks   Comment: rarely  . Drug use: No    Home Medications Prior to Admission medications   Medication Sig Start Date End Date Taking? Authorizing Provider  gabapentin (NEURONTIN) 300 MG capsule Take 300 mg by mouth 4 (four) times daily. 03/01/18   [provider]  HYDROmorphone (DILAUDID) 4 MG tablet TK 1 T PO  Q 6 H PRN 09/17/18   [provider]  pramipexole (MIRAPEX) 0.25 MG tablet Take 0.25 mg by mouth at bedtime as needed (restless legs).     [provider]    Allergies    Sulfa antibiotics  Review of Systems   Review of Systems  Constitutional: Negative for appetite change, chills and fever.  Respiratory: Negative for cough and shortness of breath.   Gastrointestinal: Negative for abdominal pain, nausea and vomiting.  Genitourinary: Positive for difficulty urinating and testicular pain. Negative for decreased urine volume, discharge, flank pain, frequency, genital sores, hematuria, penile pain, penile swelling, scrotal swelling and urgency.  Musculoskeletal: Negative for back pain.  Neurological: Negative for light-headedness.  All other systems reviewed and are negative.   Physical Exam Updated Vital Signs BP (!) 117/104 (BP Location: Right Arm)   Pulse (!) 113   Temp 98.1 F (36.7 C)   Resp 20   Ht 5\' 10"  (1.778 m)  Wt 108.9 kg   SpO2 99%   BMI 34.44 kg/m   Physical Exam Vitals and nursing note reviewed. Exam conducted with a chaperone present.  Constitutional:      General: He is not in acute distress.    Appearance: Normal appearance. He is diaphoretic. He is not toxic-appearing.     Comments: Appears in pain  HENT:     Head: Normocephalic.  Eyes:     Conjunctiva/sclera: Conjunctivae normal.  Cardiovascular:     Rate and Rhythm: Normal rate and regular rhythm.  Pulmonary:     Effort: Pulmonary effort is normal.  Abdominal:     General: Abdomen is flat. Bowel sounds are normal.     Palpations: Abdomen is soft.     Tenderness: There  is no right CVA tenderness or left CVA tenderness.  Genitourinary:    Comments: Left testicle is high riding and tender to palpation.  Does not feel overtly swollen.  No mass.  No palpable inguinal hernia. Neurological:     Mental Status: He is alert.  Psychiatric:        Mood and Affect: Mood normal.     ED Results / Procedures / Treatments   Labs (all labs ordered are listed, but only abnormal results are displayed) Labs Reviewed  CBC WITH DIFFERENTIAL/PLATELET  URINALYSIS, ROUTINE W REFLEX MICROSCOPIC  BASIC METABOLIC PANEL    EKG None  Radiology No results found.  Procedures Procedures (including critical care time)  Medications Ordered in ED Medications  sodium chloride 0.9 % bolus 1,000 mL (1,000 mLs Intravenous New Bag/Given 08/31/19 1532)  ketorolac (TORADOL) 30 MG/ML injection 30 mg (has no administration in time range)  morphine 4 MG/ML injection 4 mg (4 mg Intravenous Given 08/31/19 1434)  ondansetron (ZOFRAN) injection 4 mg (4 mg Intravenous Given 08/31/19 1440)  HYDROmorphone (DILAUDID) injection 1 mg (1 mg Intravenous Given 08/31/19 1451)  HYDROmorphone (DILAUDID) injection 1 mg (1 mg Intravenous Given 08/31/19 1531)    ED Course  I have reviewed the triage vital signs and the nursing notes.  Pertinent labs & imaging results that were available during my care of the patient were reviewed by me and considered in my medical decision making (see chart for details).  Clinical Course as of Aug 30 1620  Sun Aug 31, 2019  1452 Patient with acute onset of left testicular pain 1 hour prior to arrival.  On exam appears in pain.  High riding testicle on the left but able to elicit reflexes.  Differential diagnosis includes testicular torsion versus epididymitis versus UTI versus kidney stone.  Since pain is localized to the testicle and he does not have belly or flank pain and initial scrotal ultrasound was ordered.  Attempted to detorse testicle myself but was not successful.   Fluids and pain meds ordered.   [KM]  1536 Spoke with Dr. Jeffie Pollock from urology who is currently reviewing the patient and patient's testicular ultrasound. There is no testicular torsion. Dr. Jeffie Pollock feels if workup negative then patient can f/u with his previous urology physician. Dr. Michaelle Copas who has previously performed testicular surgery on this patient. Patient is pretty opiod tolerant as he takes a significant amount of oxycodone at home for chronic pain. Other etiologies include possible referred pain from kidney stone vs neuropathic pain from back surgery. Discussed Ct with patient and patient's wife who would like to have this done. If negative then patient can f/u outpatient with Dr. Michaelle Copas.    [KM]  5277 Care transferred to  Marius Ditch PA due to change of shift   [KM]    Clinical Course User Index [KM] Jeral Pinch   MDM Rules/Calculators/A&P                       Final Clinical Impression(s) / ED Diagnoses Final diagnoses:  None    Rx / DC Orders ED Discharge Orders    None       Jeral Pinch 08/31/19 1622    Jacalyn Lefevre, MD 08/31/19 1651

## 2019-08-31 NOTE — ED Provider Notes (Signed)
Care assumed from [facility], PA-C at shift change with CT abd/pelvis pending.   In brief, this patient is a 34 y.o. M with PMH/o chronic low back pain presents to the ED for evaluation of left sided testicular pain that began about an hour ago. No associated abdominal pain, flank pain, nausea/vomiting. No history of kidney stones. Please see note from previous provider for full history/physical exam.    Physical Exam  BP (!) 136/98   Pulse 91   Temp 98.5 F (36.9 C) (Oral)   Resp 18   Ht 5\' 10"  (1.778 m)   Wt 108.9 kg   SpO2 96%   BMI 34.44 kg/m   Physical Exam  ED Course/Procedures   Clinical Course as of Aug 30 1940  Sun Aug 31, 2019  1452 Patient with acute onset of left testicular pain 1 hour prior to arrival.  On exam appears in pain.  High riding testicle on the left but able to elicit reflexes.  Differential diagnosis includes testicular torsion versus epididymitis versus UTI versus kidney stone.  Since pain is localized to the testicle and he does not have belly or flank pain and initial scrotal ultrasound was ordered.  Attempted to detorse testicle myself but was not successful.  Fluids and pain meds ordered.   [KM]  1536 Spoke with Dr. Jeffie Pollock from urology who is currently reviewing the patient and patient's testicular ultrasound. There is no testicular torsion. Dr. Jeffie Pollock feels if workup negative then patient can f/u with his previous urology physician. Dr. Michaelle Copas who has previously performed testicular surgery on this patient. Patient is pretty opiod tolerant as he takes a significant amount of oxycodone at home for chronic pain. Other etiologies include possible referred pain from kidney stone vs neuropathic pain from back surgery. Discussed Ct with patient and patient's wife who would like to have this done. If negative then patient can f/u outpatient with Dr. Michaelle Copas.    [KM]  [place] transferred to  [facility] PA due to change of shift   [KM]    Clinical Course User  Index [KM] Alveria Apley, PA-C    Procedures  MDM   PLAN:  Plan for f/u labs and CT scan.   MDM:  BMP shows BUN of 11, creatinine of 1.34.  This is slightly higher than his normal creatinine.  Given bolus of fluids.  CBC shows no leukocytosis or anemia.  Ultrasound without signs of any testicular torsion.  CT abdomen shows no acute abnormalities.  No evidence of kidney stone.  UA negative for any infectious etiology.  I discussed results with patient.  Will give dose of pain medication before discharge.  At this time, unclear what etiology of the nature of patient's symptoms but acute emergency condition such as testicular torsion, kidney stone, infectious etiology have been ruled out.  Per previous providers recommendation, patient will need to follow-up with his urologist.  Patient with history of chronic pain and is followed by pain clinic.  At this time, no indication for acute narcotic prescription.  Patient reviewed on PMP with a narcotic score of 650.  At this time, patient exhibits no emergent life-threatening condition that require further evaluation in ED or admission. Patient had ample opportunity for questions and discussion. All patient's questions were answered with full understanding. Strict return precautions discussed. Patient expresses understanding and agreement to plan.    1. Testicular pain, left     Portions of this note were generated with Dragon dictation software. Dictation  errors may occur despite best attempts at proofreading.    Maxwell Caul, PA-C 08/31/19 1942    Vanetta Mulders, MD 09/01/19 1550

## 2019-12-30 ENCOUNTER — Encounter (HOSPITAL_COMMUNITY): Payer: Self-pay | Admitting: Emergency Medicine

## 2019-12-30 ENCOUNTER — Emergency Department (HOSPITAL_COMMUNITY)

## 2019-12-30 ENCOUNTER — Emergency Department (HOSPITAL_COMMUNITY)
Admission: EM | Admit: 2019-12-30 | Discharge: 2019-12-30 | Disposition: A | Discharge status: Home / Self Care | Attending: Emergency Medicine | Admitting: Emergency Medicine

## 2019-12-30 DIAGNOSIS — Z20822 Contact with and (suspected) exposure to covid-19: Secondary | ICD-10-CM | POA: Insufficient documentation

## 2019-12-30 DIAGNOSIS — Z79899 Other long term (current) drug therapy: Secondary | ICD-10-CM | POA: Insufficient documentation

## 2019-12-30 DIAGNOSIS — R0602 Shortness of breath: Secondary | ICD-10-CM | POA: Diagnosis present

## 2019-12-30 LAB — BASIC METABOLIC PANEL
Anion gap: 11 (ref 5–15)
BUN: 8 mg/dL (ref 6–20)
CO2: 26 mmol/L (ref 22–32)
Calcium: 10 mg/dL (ref 8.9–10.3)
Chloride: 101 mmol/L (ref 98–111)
Creatinine, Ser: 1.14 mg/dL (ref 0.61–1.24)
GFR calc Af Amer: >60 mL/min (ref >60)
GFR calc non Af Amer: >60 mL/min (ref >60)
Glucose, Bld: 166 mg/dL — ABNORMAL HIGH (ref 70–99)
Potassium: 4.1 mmol/L (ref 3.5–5.1)
Sodium: 138 mmol/L (ref 135–145)

## 2019-12-30 LAB — HEPATIC FUNCTION PANEL
ALT: 83 U/L — ABNORMAL HIGH (ref 0–44)
AST: 57 U/L — ABNORMAL HIGH (ref 15–41)
Albumin: 4.6 g/dL (ref 3.5–5.0)
Alkaline Phosphatase: 67 U/L (ref 38–126)
Bilirubin, Direct: 0.2 mg/dL (ref 0.0–0.2)
Indirect Bilirubin: 0.7 mg/dL (ref 0.3–0.9)
Total Bilirubin: 0.9 mg/dL (ref 0.3–1.2)
Total Protein: 7.2 g/dL (ref 6.5–8.1)

## 2019-12-30 LAB — URINALYSIS, ROUTINE W REFLEX MICROSCOPIC
Bilirubin Urine: NEGATIVE
Glucose, UA: NEGATIVE mg/dL
Hgb urine dipstick: NEGATIVE
Ketones, ur: NEGATIVE mg/dL
Leukocytes,Ua: NEGATIVE
Nitrite: NEGATIVE
Protein, ur: NEGATIVE mg/dL
Specific Gravity, Urine: 1.009 (ref 1.005–1.030)
pH: 8 (ref 5.0–8.0)

## 2019-12-30 LAB — CBC
HCT: 43.6 % (ref 39.0–52.0)
Hemoglobin: 15.4 g/dL (ref 13.0–17.0)
MCH: 30.7 pg (ref 26.0–34.0)
MCHC: 35.3 g/dL (ref 30.0–36.0)
MCV: 87 fL (ref 80.0–100.0)
Platelets: 320 10*3/uL (ref 150–400)
RBC: 5.01 MIL/uL (ref 4.22–5.81)
RDW: 11.5 % (ref 11.5–15.5)
WBC: 4.8 10*3/uL (ref 4.0–10.5)
nRBC: 0 % (ref 0.0–0.2)

## 2019-12-30 LAB — SARS CORONAVIRUS 2 BY RT PCR (HOSPITAL ORDER, PERFORMED IN ~~LOC~~ HOSPITAL LAB): SARS Coronavirus 2: NEGATIVE

## 2019-12-30 MED ORDER — ONDANSETRON 4 MG PO TBDP
8.0000 mg | ORAL_TABLET | Freq: Once | ORAL | Status: AC
  Administered 2019-12-30: 8 mg via ORAL
  Filled 2019-12-30: qty 2

## 2019-12-30 MED ORDER — ONDANSETRON 4 MG PO TBDP: 4.0000 mg | ORAL_TABLET | Freq: Three times a day (TID) | ORAL | 0 refills | Status: AC | PRN

## 2019-12-30 MED ORDER — ALBUTEROL SULFATE HFA 108 (90 BASE) MCG/ACT IN AERS
4.0000 | INHALATION_SPRAY | Freq: Once | RESPIRATORY_TRACT | Status: AC
  Administered 2019-12-30: 4 via RESPIRATORY_TRACT
  Filled 2019-12-30: qty 6.7

## 2019-12-30 NOTE — ED Notes (Signed)
Pt able to drink some ginger ale and keep it down

## 2019-12-30 NOTE — ED Notes (Signed)
When walking into room pt o2 was 95% sitting on bed When walking around room pt o2 went up to 100% and  Hr 98. Pt did not complain of SOB

## 2019-12-30 NOTE — ED Triage Notes (Signed)
Pt reports over the weekend he began having some sob and some tingling in his hands, reports he also had some nausea and vomiting over the weekend as well. resp e/u, nad.

## 2019-12-30 NOTE — ED Provider Notes (Signed)
[facility] EMERGENCY DEPARTMENT Provider Note   CSN: [id number] Arrival date & time: 12/30/19  5027     History Chief Complaint  Patient presents with  . Shortness of Breath    Lance Carter is a 34 y.o. male with history of chronic pain with opioid dependence who presents with shortness of breath.  Patient states that he has had shortness of breath with exertion for the past 3 to 4 days.  He reports associated nasal congestion and runny nose as well as nausea, vomiting, and diarrhea.  States that he does have persistent nausea but has only had one episode of vomiting a couple days ago.  He is having several episodes of loose stools as well but in is not severe.  He denies fever, chills but he is having sweats at night.  He denies chest pain but states it feels like he has more of a pressure on his chest.  He denies cough or wheezing.  He does not smoke.  He denies abdominal pain or any urinary symptoms.  Today he was dropping his son off at daycare and was so short of breath getting him out of the car that his fingers became tingly and he was lightheaded and felt like he was going to pass out and therefore he decided to come to the emergency department. No recent surgery/travel/immobilization, hx of cancer, leg swelling, hemoptysis, prior DVT/PE, or hormone use.  He states he has had Covid in the past which did not feel similar.  He has not been vaccinated.    HPI     Past Medical History:  Diagnosis Date  . Chronic lower back pain 2015   with left lumbar radiculopathy  . History of chicken pox   . S/P discectomy for herniated nucleus pulposus 04/2014    Patient Active Problem List   Diagnosis Date Noted  . 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 Allodynia 10/01/2017  . 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 Intractable pain 09/28/2017  . Chronic lower back pain   . S/P discectomy for herniated nucleus pulposus 04/26/2014    Past Surgical History:    Procedure Laterality Date  . INGUINAL HERNIA REPAIR     as child  . 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 Alcohol abuse Maternal Grandmother        cirrhosis, rest of maternal side  . Diabetes Paternal Grandfather     Social History   Tobacco Use  . Smoking status: Never Smoker  . Smokeless tobacco: Never Used  Substance Use Topics  . Alcohol use: Yes    Alcohol/week: 0.0 standard drinks    Comment: rarely  . Drug use: No    Home Medications Prior to Admission medications   Medication Sig Start Date End Date Taking? Authorizing Provider  DULoxetine (CYMBALTA) 60 MG capsule Take 60 mg by mouth daily. 08/27/19   [provider]  gabapentin (NEURONTIN) 300 MG capsule Take 300 mg by mouth 4 (four) times daily. 03/01/18   [provider]  Oxycodone HCl 20 MG TABS Take 1 tablet by mouth 3 (three) times daily. 08/30/19   [provider]  OXYCONTIN 20 MG 12 hr tablet Take 20 mg by mouth 2 (two) times daily. 08/27/19   [provider]  pramipexole (MIRAPEX) 0.25 MG tablet Take 0.25 mg by mouth at bedtime as needed (restless legs).  [provider]    Allergies    Sulfa antibiotics  Review of Systems   Review of Systems  Constitutional: Positive for diaphoresis. Negative for chills and fever.  HENT: Positive for congestion and rhinorrhea.   Respiratory: Positive for shortness of breath. Negative for cough and wheezing.   Cardiovascular: Negative for chest pain and leg swelling.       +pressure  Gastrointestinal: Positive for diarrhea, nausea and vomiting. Negative for abdominal pain.  Genitourinary: Negative for dysuria.  Neurological: Positive for light-headedness.  All other systems reviewed and are negative.   Physical Exam Updated Vital Signs BP (!) 142/88   Pulse 82   Temp 98.6 F (37 C)   Resp 16   SpO2 98%   Physical Exam Vitals and nursing  note reviewed.  Constitutional:      General: He is not in acute distress.    Appearance: He is well-developed. He is obese. He is not ill-appearing.  HENT:     Head: Normocephalic and atraumatic.     Right Ear: Tympanic membrane normal.     Left Ear: Tympanic membrane normal.     Nose: Nose normal.     Mouth/Throat:     Mouth: Mucous membranes are moist.  Eyes:     General: No scleral icterus.       Right eye: No discharge.        Left eye: No discharge.     Conjunctiva/sclera: Conjunctivae normal.     Pupils: Pupils are equal, round, and reactive to light.  Cardiovascular:     Rate and Rhythm: Normal rate and regular rhythm.  Pulmonary:     Effort: Pulmonary effort is normal. No respiratory distress.     Breath sounds: Normal breath sounds.  Abdominal:     General: There is no distension.     Palpations: Abdomen is soft.     Tenderness: There is no abdominal tenderness.  Musculoskeletal:     Cervical back: Normal range of motion.     Right lower leg: No edema.     Left lower leg: No edema.  Skin:    General: Skin is warm and dry.  Neurological:     Mental Status: He is alert and oriented to person, place, and time.  Psychiatric:        Behavior: Behavior normal.     ED Results / Procedures / Treatments   Labs (all labs ordered are listed, but only abnormal results are displayed) Labs Reviewed  BASIC METABOLIC PANEL - Abnormal; Notable for the following components:      Result Value   Glucose, Bld 166 (*)    All other components within normal limits  SARS CORONAVIRUS 2 BY RT PCR (HOSPITAL ORDER, PERFORMED IN [facility])  CBC  URINALYSIS, ROUTINE W REFLEX MICROSCOPIC  HEPATIC FUNCTION PANEL    EKG EKG Interpretation  Date/Time:  Tuesday December 30 2019 08:25:14 EDT Ventricular Rate:  93 PR Interval:  168 QRS Duration: 86 QT Interval:  334 QTC Calculation: 415 R Axis:   29 Text Interpretation: Sinus rhythm with marked sinus arrhythmia Otherwise  normal ECG No significant change since last tracing Confirmed by Jacalyn Lefevre [phone]) on 12/30/2019 9:09:45 AM   Radiology DG Chest 2 View  Result Date: 12/30/2019 CLINICAL DATA:  Shortness of breath EXAM: CHEST - 2 VIEW COMPARISON:  November 27, 2016 FINDINGS: Lungs are clear. Heart size and pulmonary vascularity are normal. No adenopathy. No bone lesions. IMPRESSION: Lungs clear.  Cardiac silhouette normal. Electronically Signed   By: Bretta Bang III M.D.   On: 12/30/2019 08:49    Procedures Procedures (including critical care time)  Medications Ordered in ED Medications - No data to display  ED Course  I have reviewed the triage vital signs and the nursing notes.  Pertinent labs & imaging results that were available during my care of the patient were reviewed by me and considered in my medical decision making (see chart for details).  34 year old male presents with URI symptoms, SOB, N/V/D for the past couple of days. His main complaint is SOB with exertion. His vitals are reassuring. Heart is regular rate and rhythm and lungs are CTA. Abdomen is soft and non-tender. There is no leg swelling. EKG is SR. CXR is negative. Labs obtained are reassuring. UA is normal and there is no evidence of severe dehydration requiring IV fluids. Pt was given albuterol and zofan. He ambulated in the hall and sats were maintained above 95% and there is no tachycardia. He was able to tolerate PO. He is PERC negative. Symptoms sound like a viral syndrome. He has already had COVID last year and states it feels different but will obtain COVID testing.   Pax Reasoner was evaluated in Emergency Department on 12/30/2019 for the symptoms described in the history of present illness. He was evaluated in the context of the global COVID-19 pandemic, which necessitated consideration that the patient might be at risk for infection with the SARS-CoV-2 virus that causes COVID-19. Institutional protocols and algorithms that  pertain to the evaluation of patients at risk for COVID-19 are in a state of rapid change based on information released by regulatory bodies including the CDC and federal and state organizations. These policies and algorithms were followed during the patient's care in the ED.   MDM Rules/Calculators/A&P                           Final Clinical Impression(s) / ED Diagnoses Final diagnoses:  Shortness of breath    Rx / DC Orders ED Discharge Orders    None       Bethel Born, PA-C 12/30/19 1137    Jacalyn Lefevre, MD 12/30/19 1149

## 2019-12-30 NOTE — Discharge Instructions (Addendum)
Please rest and drink plenty of fluids Use inhaler as needed for shortness of breath Please return to the ER or go to urgent care if you are worsening

## 2020-01-23 ENCOUNTER — Ambulatory Visit
Admission: EM | Admit: 2020-01-23 | Discharge: 2020-01-23 | Disposition: A | Discharge status: Home / Self Care | Attending: Emergency Medicine | Admitting: Emergency Medicine

## 2020-01-23 DIAGNOSIS — J029 Acute pharyngitis, unspecified: Secondary | ICD-10-CM | POA: Diagnosis not present

## 2020-01-23 LAB — POCT RAPID STREP A (OFFICE): Rapid Strep A Screen: NEGATIVE

## 2020-01-23 NOTE — Discharge Instructions (Signed)
Your rapid strep test is negative.  A throat culture is pending; we will call you if it is positive requiring treatment.    Your COVID test is pending.  You should self quarantine until the test result is back.    Take Tylenol as needed for fever or discomfort.  Rest and keep yourself hydrated.    Go to the emergency department if you develop acute worsening symptoms.     

## 2020-01-23 NOTE — ED Triage Notes (Signed)
Patient reports his son brought home RSV from daycare. States that he now has a sore throat and swollen glands in his neck. States it hurts worse to talk or swallow.

## 2020-01-23 NOTE — ED Provider Notes (Signed)
Lance Carter    CSN: [id number] Arrival date & time: 01/23/20  1019      History   Chief Complaint Chief Complaint  Patient presents with  . Sore Throat    HPI Lance Carter is a 34 y.o. male.   Patient presents with sore throat x2 days.  He states it is painful to swallow but he is able to eat and drink.  He also reports nonproductive cough and shortness of breath with exertion x 3 weeks.  He reports his son had RSV 3 weeks ago.  He denies fever, chills, rash, vomiting, diarrhea, or other symptoms.  Treatment attempted at home with ibuprofen.  The history is provided by the patient.    Past Medical History:  Diagnosis Date  . Chronic lower back pain 2015   with left lumbar radiculopathy  . History of chicken pox   . S/P discectomy for herniated nucleus pulposus 04/2014    Patient Active Problem List   Diagnosis Date Noted  . 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 Allodynia 10/01/2017  . 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 Intractable pain 09/28/2017  . Chronic lower back pain   . S/P discectomy for herniated nucleus pulposus 04/26/2014    Past Surgical History:  Procedure Laterality Date  . INGUINAL HERNIA REPAIR     as child  . 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 Authorizing Provider  BELBUCA 750 MCG FILM Take 1 strip by mouth 2 (two) times daily. 12/03/19   [provider]  gabapentin (NEURONTIN) 300 MG capsule Take 300 mg by mouth 4 (four) times daily. 03/01/18   [provider]  ondansetron (ZOFRAN ODT) 4 MG disintegrating tablet Take 1 tablet (4 mg total) by mouth every 8 (eight) hours as needed for nausea or vomiting. 12/30/19   Bethel Born, PA-C  Oxycodone HCl 20 MG TABS Take 1 tablet by mouth 4 (four) times daily as needed (pain).  08/30/19   [provider]  OXYCONTIN 20 MG 12 hr tablet  Take 20 mg by mouth 2 (two) times daily. 08/27/19   [provider]  pramipexole (MIRAPEX) 0.25 MG tablet Take 0.25 mg by mouth at bedtime as needed (restless legs).     [provider]    Family History Family History  Problem Relation Age of Onset  . Alcohol abuse Maternal Grandmother        cirrhosis, rest of maternal side  . Diabetes Paternal Grandfather     Social History Social History   Tobacco Use  . Smoking status: Never Smoker  . Smokeless tobacco: Never Used  Substance Use Topics  . Alcohol use: Yes    Alcohol/week: 0.0 standard drinks    Comment: rarely  . Drug use: No     Allergies   Sulfa antibiotics   Review of Systems Review of Systems  Constitutional: Negative for chills and fever.  HENT: Positive for sore throat. Negative for ear pain.   Eyes: Negative for pain and visual disturbance.  Respiratory: Positive for cough and shortness of breath.   Cardiovascular: Negative for chest pain and palpitations.  Gastrointestinal: Positive for nausea. Negative for abdominal pain, diarrhea and vomiting.  Genitourinary: Negative for dysuria and hematuria.  Musculoskeletal: Negative for arthralgias and back pain.  Skin: Negative for color change and rash.  Neurological: Negative  for seizures and syncope.  All other systems reviewed and are negative.    Physical Exam Triage Vital Signs ED Triage Vitals [01/23/20 1020]  Enc Vitals Group     BP      Pulse      Resp      Temp      Temp src      SpO2      Weight      Height      Head Circumference      Peak Flow      Pain Score 9     Pain Loc      Pain Edu?      Excl. in GC?    No data found.  Updated Vital Signs BP (!) 121/86   Pulse 99   Temp 98.7 F (37.1 C)   Resp 19   Visual Acuity Right Eye Distance:   Left Eye Distance:   Bilateral Distance:    Right Eye Near:   Left Eye Near:    Bilateral Near:     Physical Exam Vitals and nursing note reviewed.    Constitutional:      General: He is not in acute distress.    Appearance: He is well-developed. He is not ill-appearing.  HENT:     Head: Normocephalic and atraumatic.     Right Ear: Tympanic membrane normal.     Left Ear: Tympanic membrane normal.     Nose: Nose normal.     Mouth/Throat:     Mouth: Mucous membranes are moist.     Pharynx: Posterior oropharyngeal erythema present. No oropharyngeal exudate.  Eyes:     Conjunctiva/sclera: Conjunctivae normal.  Cardiovascular:     Rate and Rhythm: Normal rate and regular rhythm.     Heart sounds: No murmur heard.   Pulmonary:     Effort: Pulmonary effort is normal. No respiratory distress.     Breath sounds: Normal breath sounds.  Abdominal:     Palpations: Abdomen is soft.     Tenderness: There is no abdominal tenderness. There is no guarding or rebound.  Musculoskeletal:     Cervical back: Neck supple.  Skin:    General: Skin is warm and dry.     Findings: No rash.  Neurological:     General: No focal deficit present.     Mental Status: He is alert and oriented to person, place, and time.     Gait: Gait normal.  Psychiatric:        Mood and Affect: Mood normal.        Behavior: Behavior normal.      UC Treatments / Results  Labs (all labs ordered are listed, but only abnormal results are displayed) Labs Reviewed  NOVEL CORONAVIRUS, NAA  POCT RAPID STREP A (OFFICE)    EKG   Radiology No results found.  Procedures Procedures (including critical care time)  Medications Ordered in UC Medications - No data to display  Initial Impression / Assessment and Plan / UC Course  I have reviewed the triage vital signs and the nursing notes.  Pertinent labs & imaging results that were available during my care of the patient were reviewed by me and considered in my medical decision making (see chart for details).   Sore throat.  Rapid strep negative; throat culture pending.  PCR COVID pending.  Instructed patient to  self quarantine until the test result is back suggested Tylenol or ibuprofen as needed for fever or discomfort.  Instructed patient to go to the ED if he has acute worsening symptoms.  Patient agrees to plan of care.   Final Clinical Impressions(s) / UC Diagnoses   Final diagnoses:  Sore throat     Discharge Instructions     Your rapid strep test is negative.  A throat culture is pending; we will call you if it is positive requiring treatment.    Your COVID test is pending.  You should self quarantine until the test result is back.    Take Tylenol as needed for fever or discomfort.  Rest and keep yourself hydrated.    Go to the emergency department if you develop acute worsening symptoms.        ED Prescriptions    None     I have reviewed the PDMP during this encounter.   Mickie Bail, NP 01/23/20 1049

## 2020-01-24 LAB — NOVEL CORONAVIRUS, NAA: SARS-CoV-2, NAA: NOT DETECTED

## 2020-01-24 LAB — SARS-COV-2, NAA 2 DAY TAT

## 2020-01-25 LAB — CULTURE, GROUP A STREP (THRC)

## 2021-01-31 ENCOUNTER — Ambulatory Visit: Admission: EM | Admit: 2021-01-31 | Discharge: 2021-01-31 | Discharge status: Against Medical Advice

## 2021-01-31 ENCOUNTER — Other Ambulatory Visit: Payer: Self-pay

## 2021-08-23 ENCOUNTER — Other Ambulatory Visit: Payer: Self-pay

## 2021-08-23 ENCOUNTER — Ambulatory Visit
Admission: RE | Admit: 2021-08-23 | Discharge: 2021-08-23 | Disposition: A | Discharge status: Home / Self Care | Source: Ambulatory Visit | Attending: Emergency Medicine | Admitting: Emergency Medicine

## 2021-08-23 VITALS — BP 125/87 | HR 77 | Temp 98.7°F | Resp 18

## 2021-08-23 DIAGNOSIS — L03211 Cellulitis of face: Secondary | ICD-10-CM | POA: Diagnosis not present

## 2021-08-23 MED ORDER — VALACYCLOVIR HCL 1 G PO TABS: 1000.0000 mg | ORAL_TABLET | Freq: Three times a day (TID) | ORAL | 0 refills | Status: AC

## 2021-08-23 MED ORDER — IBUPROFEN 600 MG PO TABS: 600.0000 mg | ORAL_TABLET | Freq: Four times a day (QID) | ORAL | 0 refills | Status: AC | PRN

## 2021-08-23 MED ORDER — TRIAMCINOLONE ACETONIDE 0.1 % EX CREA: 1.0000 "application " | TOPICAL_CREAM | Freq: Two times a day (BID) | CUTANEOUS | 0 refills | Status: AC

## 2021-08-23 MED ORDER — CEPHALEXIN 500 MG PO CAPS: 1000.0000 mg | ORAL_CAPSULE | Freq: Two times a day (BID) | ORAL | 0 refills | Status: AC

## 2021-08-23 NOTE — ED Provider Notes (Signed)
HPI  SUBJECTIVE:  Lance Carter is a 36 y.o. male who presents with a painful, pruritic, erythematous area in the middle/right side of his forehead starting last night.  He states that it is hypersensitive and painful to palpation.  He also reports diffuse itching over his entire scalp, erythema and bumps on his right cheek.  No more sun exposure than usual.  No trauma to the area, no new lotions, soaps, detergents, face washes, fevers, eye pain, visual changes, rash elsewhere.  No current antibiotics.  He is not on any new medications.  He tried Benadryl last night without improvement in his symptoms.  Symptoms are worse with palpation.  He has a past medical history of shingles on his left back.  No history of diabetes, hypertension.  PCP: At [facility].  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  Current Outpatient Medications:    amphetamine-dextroamphetamine (ADDERALL) 20 MG tablet, , Disp: , Rfl:    cephALEXin (KEFLEX) 500 MG capsule, Take 2 capsules (1,000 mg total) by mouth 2 (two) times daily for 5 days., Disp: 20 capsule, Rfl: 0   gabapentin (NEURONTIN) 300 MG capsule, Take 300 mg by mouth 4 (four) times daily., Disp: , Rfl: 1    ibuprofen (ADVIL) 600 MG tablet, Take 1 tablet (600 mg total) by mouth every 6 (six) hours as needed., Disp: 20 tablet, Rfl: 0   ondansetron (ZOFRAN ODT) 4 MG disintegrating tablet, Take 1 tablet (4 mg total) by mouth every 8 (eight) hours as needed for nausea or vomiting., Disp: 8 tablet, Rfl: 0   Oxycodone HCl 20 MG TABS, Take 1 tablet by mouth 4 (four) times daily as needed (pain). , Disp: , Rfl:    pantoprazole (PROTONIX) 40 MG tablet, Take 1 tablet by mouth daily., Disp: , Rfl:    triamcinolone cream (KENALOG) 0.1 %, Apply 1 application topically 2 (two) times daily. Apply for 2 weeks. May use on face, Disp: 30 g, Rfl: 0   valACYclovir (VALTREX) 1000 MG tablet, Take 1 tablet (1,000 mg total) by mouth 3 (three) times daily for 7 days., Disp: 21 tablet, Rfl: 0   BELBUCA 750 MCG FILM, Take 1 strip by mouth 2 (two) times daily., Disp: , Rfl:    buPROPion (WELLBUTRIN) 75 MG tablet, Take 75 mg by mouth 2 (two) times daily., Disp: , Rfl:    OXYCONTIN 20 MG 12 hr tablet, Take 20 mg by mouth 2 (two) times daily., Disp: , Rfl:    pramipexole (MIRAPEX) 0.25 MG tablet, Take 0.25 mg by mouth at bedtime as needed (  restless legs). , Disp: , Rfl:   Allergies  Allergen Reactions   Sulfa Antibiotics Hives and Rash     ROS  As noted in HPI.   Physical Exam  BP 125/87 (BP Location: Left Arm)    Pulse 77    Temp 98.7 F (37.1 C) (Oral)    Resp 18    SpO2 97%   Constitutional: Well developed, well nourished, no acute distress Eyes:  EOMI, conjunctiva normal bilaterally.  No direct or consensual photophobia.  No periorbital erythema, edema. HENT: Normocephalic, atraumatic,mucus membranes moist Respiratory: Normal inspiratory effort Cardiovascular: Normal rate GI: nondistended skin: Area of tender redness, slight swelling in the middle/right side of the forehead.  No vesicular rash.  No crusting.  No tenderness over the cheek.  No other facial rash.     Musculoskeletal: no deformities Neurologic:  Alert & oriented x 3, no focal neuro deficits Psychiatric: Speech and behavior appropriate   ED Course   Medications - No data to display  No orders of the defined types were placed in this encounter.   No results found for this or any previous visit (from the past 24 hour(s)). No results found.  ED Clinical Impression  1. Cellulitis of face      ED Assessment/Plan  Could be early shingles, discussed with patient that it is extremely difficult to make the diagnosis until the characteristic rash shows up.  Suspect perhaps a cellulitis since it seems to cross the midline, however, it is primarily on the right forehead.  There does not appear to be any ocular involvement.  will try patient on 5 days of Keflex, triamcinolone cream.  We had a discussion about starting Valtrex today, patient would prefer o start it today as  he is worried  about shingles.  However, he will return to the clinic should vesicular rash develop for reevaluation and to make sure that there is no ocular involvement.  Discussed MDM, treatment plan, and plan for follow-up with patient.  patient agrees with plan.   Meds ordered this encounter  Medications   cephALEXin (KEFLEX) 500 MG capsule    Sig: Take 2 capsules (1,000 mg total) by mouth 2 (two) times daily for 5 days.    Dispense:  20 capsule    Refill:  0   ibuprofen (ADVIL) 600 MG tablet    Sig: Take 1 tablet (600 mg total) by mouth every 6 (six) hours as needed.    Dispense:  20 tablet    Refill:  0   valACYclovir (VALTREX) 1000 MG tablet    Sig: Take 1 tablet (1,000 mg total) by mouth 3 (three) times daily for 7 days.    Dispense:  21 tablet    Refill:  0   triamcinolone cream (KENALOG) 0.1 %    Sig: Apply 1 application topically 2 (two) times daily. Apply for 2 weeks. May use on face    Dispense:  30 g    Refill:  0      *This clinic note was created using Scientist, clinical (histocompatibility and immunogenetics). Therefore, there may be occasional mistakes despite careful  proofreading.  ?    Domenick Gong, MD 08/24/21 [phone]

## 2021-08-23 NOTE — ED Triage Notes (Signed)
Pt presents with a rash on his face that is itchy and peeling since yesterday.

## 2021-08-23 NOTE — Discharge Instructions (Addendum)
This could be early shingles, but could also be a small skin infection.  I am treating a possible skin infection with Keflex.  The triamcinolone cream will help with pain, swelling and itching.  Go ahead and start the Valtrex today, however, if a blistery rash shows up, you need to seek medical reevaluation to make sure that it has not gotten into your eye

## 2021-12-19 ENCOUNTER — Ambulatory Visit
Admission: EM | Admit: 2021-12-19 | Discharge: 2021-12-19 | Disposition: A | Discharge status: Home / Self Care | Attending: Emergency Medicine | Admitting: Emergency Medicine

## 2021-12-19 DIAGNOSIS — H1031 Unspecified acute conjunctivitis, right eye: Secondary | ICD-10-CM

## 2021-12-19 MED ORDER — OFLOXACIN 0.3 % OP SOLN: 1.0000 [drp] | Freq: Four times a day (QID) | OPHTHALMIC | 0 refills | Status: AC
